# Patient Record
Sex: Female | Born: 1995 | Race: Black or African American | Hispanic: No | Marital: Single | Smoking: Never smoker
Health system: Southern US, Community
[De-identification: ages and names within clinical notes are randomized; demographics above are authoritative.]

## PROBLEM LIST (undated history)

## (undated) DIAGNOSIS — K219 Gastro-esophageal reflux disease without esophagitis: Secondary | ICD-10-CM

## (undated) DIAGNOSIS — R519 Headache, unspecified: Secondary | ICD-10-CM

## (undated) DIAGNOSIS — M419 Scoliosis, unspecified: Secondary | ICD-10-CM

## (undated) DIAGNOSIS — R51 Headache: Secondary | ICD-10-CM

## (undated) DIAGNOSIS — J309 Allergic rhinitis, unspecified: Secondary | ICD-10-CM

## (undated) DIAGNOSIS — E669 Obesity, unspecified: Secondary | ICD-10-CM

## (undated) DIAGNOSIS — M26609 Unspecified temporomandibular joint disorder, unspecified side: Secondary | ICD-10-CM

## (undated) DIAGNOSIS — E119 Type 2 diabetes mellitus without complications: Secondary | ICD-10-CM

## (undated) HISTORY — DX: Allergic rhinitis, unspecified: J30.9

## (undated) HISTORY — DX: Unspecified temporomandibular joint disorder, unspecified side: M26.609

## (undated) HISTORY — PX: OTHER SURGICAL HISTORY: SHX169

## (undated) HISTORY — DX: Headache: R51

## (undated) HISTORY — DX: Obesity, unspecified: E66.9

## (undated) HISTORY — DX: Gastro-esophageal reflux disease without esophagitis: K21.9

## (undated) HISTORY — DX: Headache, unspecified: R51.9

---
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
## 2014-01-31 ENCOUNTER — Encounter (HOSPITAL_COMMUNITY): Payer: Self-pay | Admitting: Emergency Medicine

## 2014-01-31 ENCOUNTER — Emergency Department (HOSPITAL_COMMUNITY)
Admission: EM | Admit: 2014-01-31 | Discharge: 2014-02-01 | Disposition: A | Discharge status: Home / Self Care | Payer: Medicaid Other | Attending: Emergency Medicine | Admitting: Emergency Medicine

## 2014-01-31 ENCOUNTER — Emergency Department (INDEPENDENT_AMBULATORY_CARE_PROVIDER_SITE_OTHER)
Admission: EM | Admit: 2014-01-31 | Discharge: 2014-01-31 | Disposition: A | Discharge status: Home / Self Care | Payer: Medicaid Other | Source: Home / Self Care | Attending: Family Medicine | Admitting: Family Medicine

## 2014-01-31 DIAGNOSIS — M549 Dorsalgia, unspecified: Secondary | ICD-10-CM | POA: Insufficient documentation

## 2014-01-31 DIAGNOSIS — E669 Obesity, unspecified: Secondary | ICD-10-CM | POA: Insufficient documentation

## 2014-01-31 DIAGNOSIS — R630 Anorexia: Secondary | ICD-10-CM | POA: Insufficient documentation

## 2014-01-31 DIAGNOSIS — R1033 Periumbilical pain: Secondary | ICD-10-CM | POA: Insufficient documentation

## 2014-01-31 DIAGNOSIS — R110 Nausea: Secondary | ICD-10-CM

## 2014-01-31 DIAGNOSIS — IMO0001 Reserved for inherently not codable concepts without codable children: Secondary | ICD-10-CM | POA: Insufficient documentation

## 2014-01-31 DIAGNOSIS — R197 Diarrhea, unspecified: Secondary | ICD-10-CM | POA: Insufficient documentation

## 2014-01-31 DIAGNOSIS — Z8739 Personal history of other diseases of the musculoskeletal system and connective tissue: Secondary | ICD-10-CM | POA: Insufficient documentation

## 2014-01-31 DIAGNOSIS — R109 Unspecified abdominal pain: Secondary | ICD-10-CM

## 2014-01-31 DIAGNOSIS — R1013 Epigastric pain: Secondary | ICD-10-CM | POA: Insufficient documentation

## 2014-01-31 DIAGNOSIS — IMO0002 Reserved for concepts with insufficient information to code with codable children: Secondary | ICD-10-CM | POA: Insufficient documentation

## 2014-01-31 LAB — POCT URINALYSIS DIP (DEVICE)
GLUCOSE, UA: NEGATIVE mg/dL
Ketones, ur: NEGATIVE mg/dL
LEUKOCYTES UA: NEGATIVE
NITRITE: NEGATIVE
PH: 5.5 (ref 5.0–8.0)
Protein, ur: NEGATIVE mg/dL
Specific Gravity, Urine: >=1.03 (ref 1.005–1.030)
UROBILINOGEN UA: 1 mg/dL (ref 0.0–1.0)

## 2014-01-31 LAB — CBC WITH DIFFERENTIAL/PLATELET
BASOS ABS: 0 10*3/uL (ref 0.0–0.1)
Basophils Relative: 0 % (ref 0–1)
EOS ABS: 0 10*3/uL (ref 0.0–0.7)
EOS PCT: 1 % (ref 0–5)
HCT: 40.6 % (ref 36.0–46.0)
Hemoglobin: 13.7 g/dL (ref 12.0–15.0)
Lymphocytes Relative: 23 % (ref 12–46)
Lymphs Abs: 1.4 10*3/uL (ref 0.7–4.0)
MCH: 27.6 pg (ref 26.0–34.0)
MCHC: 33.7 g/dL (ref 30.0–36.0)
MCV: 81.9 fL (ref 78.0–100.0)
Monocytes Absolute: 0.4 10*3/uL (ref 0.1–1.0)
Monocytes Relative: 7 % (ref 3–12)
NEUTROS PCT: 69 % (ref 43–77)
Neutro Abs: 4.1 10*3/uL (ref 1.7–7.7)
PLATELETS: 263 10*3/uL (ref 150–400)
RBC: 4.96 MIL/uL (ref 3.87–5.11)
RDW: 12.7 % (ref 11.5–15.5)
WBC: 6 10*3/uL (ref 4.0–10.5)

## 2014-01-31 LAB — COMPREHENSIVE METABOLIC PANEL
ALBUMIN: 3.8 g/dL (ref 3.5–5.2)
ALK PHOS: 97 U/L (ref 39–117)
ALT: 21 U/L (ref 0–35)
AST: 18 U/L (ref 0–37)
BUN: 11 mg/dL (ref 6–23)
CALCIUM: 9.1 mg/dL (ref 8.4–10.5)
CO2: 22 mEq/L (ref 19–32)
Chloride: 104 mEq/L (ref 96–112)
Creatinine, Ser: 0.89 mg/dL (ref 0.50–1.10)
GFR calc Af Amer: >90 mL/min (ref >90)
GFR calc non Af Amer: >90 mL/min (ref >90)
Glucose, Bld: 89 mg/dL (ref 70–99)
POTASSIUM: 3.7 meq/L (ref 3.7–5.3)
SODIUM: 139 meq/L (ref 137–147)
TOTAL PROTEIN: 7.3 g/dL (ref 6.0–8.3)
Total Bilirubin: 1.3 mg/dL — ABNORMAL HIGH (ref 0.3–1.2)

## 2014-01-31 LAB — LIPASE, BLOOD: Lipase: 20 U/L (ref 11–59)

## 2014-01-31 LAB — POCT PREGNANCY, URINE: Preg Test, Ur: NEGATIVE

## 2014-01-31 MED ORDER — GI COCKTAIL ~~LOC~~
30.0000 mL | Freq: Once | ORAL | Status: AC
  Administered 2014-01-31: 30 mL via ORAL
  Filled 2014-01-31: qty 30

## 2014-01-31 MED ORDER — DICYCLOMINE HCL 10 MG PO CAPS
10.0000 mg | ORAL_CAPSULE | Freq: Once | ORAL | Status: AC
  Administered 2014-01-31: 10 mg via ORAL
  Filled 2014-01-31: qty 1

## 2014-01-31 NOTE — ED Provider Notes (Signed)
CSN: [id number]     Arrival date & time 01/31/14  2025 History   First MD Initiated Contact with Patient 01/31/14 2239     Chief Complaint  Patient presents with  . Abdominal Pain  . Back Pain     (Consider location/radiation/quality/duration/timing/severity/associated sxs/prior Treatment) HPI Pt is an 18yo female presenting to ED from UC for concern of appendicitis. Pt c/o abdominal pain radiating into her back. Symptoms have been constant, waking her from her sleep around 5am this morning. Pain is sharp and stabbing in nature, 9/10, associated with nausea and diarrhea.  Reports several episodes of watery diarrhea w/o blood or mucous in stool. Denies preceding episodes of constipation. Pt denies vomiting or fevers. Reports being seen by her PCP at [facility] where UA was performed and tramadol given, advised to f/u April 6th, or return sooner if symptoms not improved. Pt states she did take 1 dose of tramadol w/o relief so she went to [facility] who directed pt to ED.  Reports similar pain last year while pt was at school, states pain felt the same, she tried to eat a cracker but vomited back up. Pt states they did not tell her why her stomach was hurting at that time but states she was given a liquid medication to take as symptoms appeared to occur every time she ate, but states she could not afford the medication at that time, states she never f/u with PCP for incident last year. Denies taking any daily medications.   Past Medical History  Diagnosis Date  . Scoliosis    History reviewed. No pertinent past surgical history. Family History  Problem Relation Age of Onset  . Diabetes Mother   . Hypertension Mother    History  Substance Use Topics  . Smoking status: Not on file  . Smokeless tobacco: Not on file  . Alcohol Use: No   OB History   Grav Para Term Preterm Abortions TAB SAB Ect Mult Living                 Review of Systems  Constitutional: Positive for appetite  change. Negative for fever, chills and fatigue.  Respiratory: Negative for shortness of breath.   Cardiovascular: Negative for chest pain.  Gastrointestinal: Positive for nausea, abdominal pain and diarrhea. Negative for vomiting, constipation and blood in stool.  Genitourinary: Negative for dysuria, frequency, hematuria, flank pain, decreased urine volume, vaginal bleeding, vaginal discharge, vaginal pain, menstrual problem and pelvic pain.  Musculoskeletal: Positive for back pain and myalgias.  All other systems reviewed and are negative.      Allergies  Review of patient's allergies indicates no known allergies.  Home Medications   Current Outpatient Rx  Name  Route  Sig  Dispense  Refill  . PRESCRIPTION MEDICATION   Intramuscular   Inject into the muscle every 3 (three) months. Birth Control -Depo Shot         . traMADol (ULTRAM) 50 MG tablet   Oral   Take 50 mg by mouth 3 (three) times daily as needed.         Marland Kitchen EXPIRED: cetirizine (ZYRTEC) 10 MG tablet   Oral   Take 1 tablet (10 mg total) by mouth daily.   30 tablet   0   . EXPIRED: fluticasone (FLONASE) 50 MCG/ACT nasal spray   Nasal   Place 2 sprays into the nose daily.   16 g   0   . promethazine (PHENERGAN) 25 MG tablet  Oral   Take 1 tablet (25 mg total) by mouth every 6 (six) hours as needed for nausea or vomiting.   12 tablet   0    BP 111/65  Pulse 98  Temp(Src) 98.6 F (37 C) (Oral)  Resp 14  Ht 5\' 3"  (1.6 m)  Wt 201 lb 8 oz (91.4 kg)  BMI 35.70 kg/m2  SpO2 98%  LMP 12/03/2013 Physical Exam  Nursing note and vitals reviewed. Constitutional: She appears well-developed and well-nourished. No distress.  Obese female lying in exam bed, NAD. Appears well, non-toxic.  HENT:  Head: Normocephalic and atraumatic.  Mouth/Throat: Uvula is midline, oropharynx is clear and moist and mucous membranes are normal.  Eyes: Conjunctivae are normal. No scleral icterus.  Neck: Normal range of motion.   Cardiovascular: Normal rate, regular rhythm and normal heart sounds.   Pulmonary/Chest: Effort normal and breath sounds normal. No respiratory distress. She has no wheezes. She has no rales. She exhibits no tenderness.  Abdominal: Soft. Bowel sounds are normal. She exhibits no distension and no mass. There is tenderness. There is no rebound and no guarding.  Obese abdomen, soft, epigastric and umbilical tenderness. No rebound, guarding, or masses. No CVAT.  Musculoskeletal: Normal range of motion.  Neurological: She is alert.  Skin: Skin is warm and dry. She is not diaphoretic.    ED Course  Procedures (including critical care time) Labs Review Labs Reviewed  COMPREHENSIVE METABOLIC PANEL - Abnormal; Notable for the following:    Total Bilirubin 1.3 (*)    All other components within normal limits  CBC WITH DIFFERENTIAL  LIPASE, BLOOD   Imaging Review No results found.   EKG Interpretation None      MDM   Final diagnoses:  Nausea  Diarrhea  Abdominal pain    Pt c/o abdominal pain associated with nausea, and diarrhea w/o blood or mucous in stool. Pt sent by UC for further evaluation for possible appendicitis.  Pt appears well, non-toxic, afebrile.  Abd-obese, soft, tender in epigastrium and umbilical region.  No rebound, guarding, or masses.  Pt had negative UA at PCP Labs: CBC, CMP, Lipase: unremarkable.  Not concerned for appendicitis, cholecystitis, SBO, or other emergent process taking place at this time. Bentyl and GI cocktail given.  Pt reassessed around 12:54AM. Pt sleeping comfortably, easily awakened.  Abdomen-soft, mild tenderness, no focal tenderness, rebound or guarding.   Will discharge pt home, discussed findings with pt and family. Discussed pt likely has gastroenteritis, family member then stated she and her son had recently been sick with similar symptoms. Advised pt f/u with PCP by the end of the week if not improving. Encouraged rest and fluids tomorrow.   Return precautions provided. Pt verbalized understanding and agreement with tx plan.        Junius FinnerErin O'Malley, PA-C 02/01/14 0122

## 2014-01-31 NOTE — ED Notes (Signed)
Pt coming from urgent care with c/o mid abdominal pain since five this morning. Pt reports diarrhea x 2 and nausea. Denies fevers at home. Pt states she was sent here for r/o appendicitis. Pt is A&Ox4, respirations equal and unlabored, skin warm and dry.

## 2014-01-31 NOTE — ED Notes (Signed)
C/o periumbilical pain onset this AM and radiates to her back.  C/o nausea when she burps but no vomiting.  C/o diarrhea once last night and 2 today. C/o getting cold but has not checked temp.  C/o body aches.

## 2014-01-31 NOTE — ED Provider Notes (Signed)
Lindsey ArenaDanielle Moon is a 18 y.o. female who presents to Urgent Care today for abdominal pain. Patient was awoken from sleep by abdominal pain at about 5:00 this morning. Her abdominal pain has steadily worsened throughout today. She was seen at her primary care office ([facility]) where she had urine tests and was given tramadol. Her pain worsened resulting in [facility]. She denies any vomiting but does note nausea. She has had several episodes of diarrhea. She denies any preceding constipation. She denies any significant fevers or chills. She does note decreased appetite today.   Past Medical History  Diagnosis Date  . Scoliosis    History  Substance Use Topics  . Smoking status: Not on file  . Smokeless tobacco: Not on file  . Alcohol Use: No   ROS as above Medications: No current facility-administered medications for this encounter.   Current Outpatient Prescriptions  Medication Sig Dispense Refill  . PRESCRIPTION MEDICATION Inject into the muscle every 3 (three) months. Birth Control -Depo Shot      . traMADol (ULTRAM) 50 MG tablet Take 50 mg by mouth 3 (three) times daily as needed.      . cetirizine (ZYRTEC) 10 MG tablet Take 1 tablet (10 mg total) by mouth daily.  30 tablet  0  . fluticasone (FLONASE) 50 MCG/ACT nasal spray Place 2 sprays into the nose daily.  16 g  0    Exam:  BP 108/73  Pulse 104  Temp(Src) 98.6 F (37 C) (Oral)  Resp 16  SpO2 100%  LMP 12/03/2013 Gen: Well NAD HEENT: EOMI,  MMM Lungs: Normal work of breathing. CTABL Heart: RRR no MRG Abd: NABS, tender to palpation central lower abdomen with guarding. No rebound. Left CV angle tenderness to percussion  Exts: Brisk capillary refill, warm and well perfused.   Results for orders placed during the hospital encounter of 01/31/14 (from the past 24 hour(s))  POCT URINALYSIS DIP (DEVICE)     Status: Abnormal   Collection Time    01/31/14  7:20 PM      Result Value Ref Range   Glucose, UA NEGATIVE  NEGATIVE mg/dL   Bilirubin Urine SMALL (*) NEGATIVE   Ketones, ur NEGATIVE  NEGATIVE mg/dL   Specific Gravity, Urine >=1.030  1.005 - 1.030   Hgb urine dipstick TRACE (*) NEGATIVE   pH 5.5  5.0 - 8.0   Protein, ur NEGATIVE  NEGATIVE mg/dL   Urobilinogen, UA 1.0  0.0 - 1.0 mg/dL   Nitrite NEGATIVE  NEGATIVE   Leukocytes, UA NEGATIVE  NEGATIVE  POCT PREGNANCY, URINE     Status: None   Collection Time    01/31/14  7:27 PM      Result Value Ref Range   Preg Test, Ur NEGATIVE  NEGATIVE   No results found.  Assessment and Plan: 18 y.o. female with  abdominal pain with guarding. Patient has worsening abdominal pain with guarding started this morning. This is concerning for appendicitis. Plan to change her to the emergency room for evaluation and management of this issue.   Discussed warning signs or symptoms. Please see discharge instructions. Patient expresses understanding.    Rodolph BongEvan S Charlina Dwight, MD 01/31/14 [id number]

## 2014-02-01 MED ORDER — PROMETHAZINE HCL 25 MG PO TABS: 25.0000 mg | ORAL_TABLET | Freq: Four times a day (QID) | ORAL | Status: DC | PRN

## 2014-02-01 NOTE — ED Notes (Signed)
Pt alert and oriented and neg visible distress

## 2014-02-03 NOTE — ED Provider Notes (Signed)
Medical screening examination/treatment/procedure(s) were performed by non-physician practitioner and as supervising physician I was immediately available for consultation/collaboration.   EKG Interpretation None       Dalina Samara, MD 02/03/14 1907 

## 2014-06-06 ENCOUNTER — Emergency Department (HOSPITAL_COMMUNITY)
Admission: EM | Admit: 2014-06-06 | Discharge: 2014-06-07 | Disposition: A | Discharge status: Home / Self Care | Payer: Medicaid Other | Attending: Emergency Medicine | Admitting: Emergency Medicine

## 2014-06-06 ENCOUNTER — Encounter (HOSPITAL_COMMUNITY): Payer: Self-pay | Admitting: Emergency Medicine

## 2014-06-06 ENCOUNTER — Emergency Department (HOSPITAL_COMMUNITY): Payer: Medicaid Other

## 2014-06-06 DIAGNOSIS — M4120 Other idiopathic scoliosis, site unspecified: Secondary | ICD-10-CM | POA: Diagnosis not present

## 2014-06-06 DIAGNOSIS — R079 Chest pain, unspecified: Secondary | ICD-10-CM | POA: Diagnosis present

## 2014-06-06 DIAGNOSIS — Z79899 Other long term (current) drug therapy: Secondary | ICD-10-CM | POA: Insufficient documentation

## 2014-06-06 DIAGNOSIS — R0789 Other chest pain: Secondary | ICD-10-CM | POA: Diagnosis not present

## 2014-06-06 DIAGNOSIS — IMO0001 Reserved for inherently not codable concepts without codable children: Secondary | ICD-10-CM | POA: Insufficient documentation

## 2014-06-06 DIAGNOSIS — M546 Pain in thoracic spine: Secondary | ICD-10-CM | POA: Insufficient documentation

## 2014-06-06 LAB — BASIC METABOLIC PANEL
Anion gap: 16 — ABNORMAL HIGH (ref 5–15)
BUN: 8 mg/dL (ref 6–23)
CHLORIDE: 103 meq/L (ref 96–112)
CO2: 22 meq/L (ref 19–32)
CREATININE: 0.86 mg/dL (ref 0.50–1.10)
Calcium: 9.5 mg/dL (ref 8.4–10.5)
GFR calc Af Amer: >90 mL/min (ref >90)
GFR calc non Af Amer: >90 mL/min (ref >90)
GLUCOSE: 110 mg/dL — AB (ref 70–99)
Potassium: 3.9 mEq/L (ref 3.7–5.3)
Sodium: 141 mEq/L (ref 137–147)

## 2014-06-06 LAB — CBC
HCT: 39.2 % (ref 36.0–46.0)
HEMOGLOBIN: 12.8 g/dL (ref 12.0–15.0)
MCH: 26.2 pg (ref 26.0–34.0)
MCHC: 32.7 g/dL (ref 30.0–36.0)
MCV: 80.3 fL (ref 78.0–100.0)
Platelets: 257 10*3/uL (ref 150–400)
RBC: 4.88 MIL/uL (ref 3.87–5.11)
RDW: 12.8 % (ref 11.5–15.5)
WBC: 5.7 10*3/uL (ref 4.0–10.5)

## 2014-06-06 LAB — I-STAT TROPONIN, ED: TROPONIN I, POC: 0 ng/mL (ref 0.00–0.08)

## 2014-06-06 MED ORDER — METHOCARBAMOL 500 MG PO TABS: 500.0000 mg | ORAL_TABLET | Freq: Three times a day (TID) | ORAL | Status: DC | PRN

## 2014-06-06 MED ORDER — IBUPROFEN 400 MG PO TABS: 400.0000 mg | ORAL_TABLET | Freq: Three times a day (TID) | ORAL | Status: DC | PRN

## 2014-06-06 MED ORDER — METHOCARBAMOL 500 MG PO TABS
500.0000 mg | ORAL_TABLET | Freq: Once | ORAL | Status: AC
  Administered 2014-06-06: 500 mg via ORAL
  Filled 2014-06-06: qty 1

## 2014-06-06 MED ORDER — IBUPROFEN 200 MG PO TABS
400.0000 mg | ORAL_TABLET | Freq: Once | ORAL | Status: AC
  Administered 2014-06-06: 400 mg via ORAL
  Filled 2014-06-06: qty 2

## 2014-06-06 NOTE — ED Notes (Signed)
Pt states that she has been having chest pain intermittently for months that has turned into upper right back pain over the past couple days.  Pt states history of back pain

## 2014-06-06 NOTE — ED Provider Notes (Signed)
CSN: [id number]     Arrival date & time 06/06/14  2040 History   First MD Initiated Contact with Patient 06/06/14 2307     Chief Complaint  Patient presents with  . Chest Pain     (Consider location/radiation/quality/duration/timing/severity/associated sxs/prior Treatment) HPI Patient presents with episodic left sided upper chest pain for the past few months. She states the pain is sharp and lasts only a few seconds. There is no known exacerbating or alleviating factors. The patient currently does not have any chest pain. Patient states she does suffer from chronic right thoracic back pain which is felt to be due to her scoliosis. She occasionally takes Tylenol for this. This is unchanged for the past few months. She denies any shortness of breath. She has no lower extremity swelling or pain. She's no recent extended travel or immobilization. Past Medical History  Diagnosis Date  . Scoliosis    History reviewed. No pertinent past surgical history. Family History  Problem Relation Age of Onset  . Diabetes Mother   . Hypertension Mother    History  Substance Use Topics  . Smoking status: Never Smoker   . Smokeless tobacco: Not on file  . Alcohol Use: No   OB History   Grav Para Term Preterm Abortions TAB SAB Ect Mult Living                 Review of Systems  Constitutional: Negative for fever and chills.  Respiratory: Negative for cough and shortness of breath.   Cardiovascular: Positive for chest pain. Negative for palpitations and leg swelling.  Gastrointestinal: Negative for nausea, vomiting, abdominal pain, diarrhea and constipation.  Genitourinary: Negative for dysuria.  Musculoskeletal: Positive for back pain and myalgias. Negative for neck pain and neck stiffness.  Skin: Negative for rash and wound.  Neurological: Negative for dizziness, weakness, light-headedness, numbness and headaches.  All other systems reviewed and are negative.     Allergies  Review of  patient's allergies indicates no known allergies.  Home Medications   Prior to Admission medications   Medication Sig Start Date End Date Taking? Authorizing Provider  fluticasone (FLONASE) 50 MCG/ACT nasal spray Place 2 sprays into both nostrils daily as needed for allergies or rhinitis.   Yes Historical Provider, MD  PRESCRIPTION MEDICATION Inject into the muscle every 3 (three) months. Birth Control -Depo Shot   Yes Historical Provider, MD   BP 109/70  Pulse 85  Temp(Src) 97.6 F (36.4 C) (Oral)  Resp 16  SpO2 100% Physical Exam  Nursing note and vitals reviewed. Constitutional: She is oriented to person, place, and time. She appears well-developed and well-nourished. No distress.  HENT:  Head: Normocephalic and atraumatic.  Mouth/Throat: Oropharynx is clear and moist.  Eyes: EOM are normal. Pupils are equal, round, and reactive to light.  Neck: Normal range of motion. Neck supple.  Cardiovascular: Normal rate and regular rhythm.  Exam reveals no gallop and no friction rub.   No murmur heard. Pulmonary/Chest: Effort normal and breath sounds normal. No respiratory distress. She has no wheezes. She has no rales. She exhibits no tenderness.  Abdominal: Soft. Bowel sounds are normal. She exhibits no distension and no mass. There is no tenderness. There is no rebound and no guarding.  Musculoskeletal: Normal range of motion. She exhibits tenderness (tender to palpation in the region of the right paraspinal thoracic muscles. Patient also has tenderness over the right lower rhomboids). She exhibits no edema.  No calf swelling or tenderness.  Neurological: She  is alert and oriented to person, place, and time.  Skin: Skin is warm and dry. No rash noted. No erythema.  Psychiatric: She has a normal mood and affect. Her behavior is normal.    ED Course  Procedures (including critical care time) Labs Review Labs Reviewed  BASIC METABOLIC PANEL - Abnormal; Notable for the following:     Glucose, Bld 110 (*)    Anion gap 16 (*)    All other components within normal limits  CBC  I-STAT TROPOININ, ED    Imaging Review Dg Chest 2 View  06/06/2014   CLINICAL DATA:  Chest pain  EXAM: CHEST  2 VIEW  COMPARISON:  None.  FINDINGS: The heart size and mediastinal contours are within normal limits. Both lungs are clear. The visualized skeletal structures are unremarkable.  IMPRESSION: No active cardiopulmonary disease.   Electronically Signed   By: Alcide CleverMark  Lukens M.D.   On: 06/06/2014 21:38     EKG Interpretation   Date/Time:  Tuesday June 06 2014 20:45:11 EDT Ventricular Rate:  105 PR Interval:  140 QRS Duration: 78 QT Interval:  328 QTC Calculation: 433 R Axis:   60 Text Interpretation:  Sinus tachycardia Otherwise normal ECG Confirmed by  Ranae PalmsYELVERTON  MD, Laraina Sulton ([id number]) on 06/06/2014 11:11:59 PM      MDM   Final diagnoses:  None    Patient with very atypical chest pain. Doubt coronary artery disease given no risk factors or PE. Suspect likely musculoskeletal in nature. Will start on ibuprofen and muscle relaxant. Patient been given return precautions. She also is advised to follow with her primary Dr.    Loren Raceravid Tresea Heine, MD 06/06/14 [phone]

## 2014-06-06 NOTE — Discharge Instructions (Signed)
Chest Pain (Nonspecific) °It is often hard to give a specific diagnosis for the cause of chest pain. There is always a chance that your pain could be related to something serious, such as a heart attack or a blood clot in the lungs. You need to follow up with your health care provider for further evaluation. °CAUSES  °· Heartburn. °· Pneumonia or bronchitis. °· Anxiety or stress. °· Inflammation around your heart (pericarditis) or lung (pleuritis or pleurisy). °· A blood clot in the lung. °· A collapsed lung (pneumothorax). It can develop suddenly on its own (spontaneous pneumothorax) or from trauma to the chest. °· Shingles infection (herpes zoster virus). °The chest wall is composed of bones, muscles, and cartilage. Any of these can be the source of the pain. °· The bones can be bruised by injury. °· The muscles or cartilage can be strained by coughing or overwork. °· The cartilage can be affected by inflammation and become sore (costochondritis). °DIAGNOSIS  °Lab tests or other studies may be needed to find the cause of your pain. Your health care provider may have you take a test called an ambulatory electrocardiogram (ECG). An ECG records your heartbeat patterns over a 24-hour period. You may also have other tests, such as: °· Transthoracic echocardiogram (TTE). During echocardiography, sound waves are used to evaluate how blood flows through your heart. °· Transesophageal echocardiogram (TEE). °· Cardiac monitoring. This allows your health care provider to monitor your heart rate and rhythm in real time. °· Holter monitor. This is a portable device that records your heartbeat and can help diagnose heart arrhythmias. It allows your health care provider to track your heart activity for several days, if needed. °· Stress tests by exercise or by giving medicine that makes the heart beat faster. °TREATMENT  °· Treatment depends on what may be causing your chest pain. Treatment may include: °¨ Acid blockers for  heartburn. °¨ Anti-inflammatory medicine. °¨ Pain medicine for inflammatory conditions. °¨ Antibiotics if an infection is present. °· You may be advised to change lifestyle habits. This includes stopping smoking and avoiding alcohol, caffeine, and chocolate. °· You may be advised to keep your head raised (elevated) when sleeping. This reduces the chance of acid going backward from your stomach into your esophagus. °Most of the time, nonspecific chest pain will improve within 2-3 days with rest and mild pain medicine.  °HOME CARE INSTRUCTIONS  °· If antibiotics were prescribed, take them as directed. Finish them even if you start to feel better. °· For the next few days, avoid physical activities that bring on chest pain. Continue physical activities as directed. °· Do not use any tobacco products, including cigarettes, chewing tobacco, or electronic cigarettes. °· Avoid drinking alcohol. °· Only take medicine as directed by your health care provider. °· Follow your health care provider's suggestions for further testing if your chest pain does not go away. °· Keep any follow-up appointments you made. If you do not go to an appointment, you could develop lasting (chronic) problems with pain. If there is any problem keeping an appointment, call to reschedule. °SEEK MEDICAL CARE IF:  °· Your chest pain does not go away, even after treatment. °· You have a rash with blisters on your chest. °· You have a fever. °SEEK IMMEDIATE MEDICAL CARE IF:  °· You have increased chest pain or pain that spreads to your arm, neck, jaw, back, or abdomen. °· You have shortness of breath. °· You have an increasing cough, or you cough   up blood. °· You have severe back or abdominal pain. °· You feel nauseous or vomit. °· You have severe weakness. °· You faint. °· You have chills. °This is an emergency. Do not wait to see if the pain will go away. Get medical help at once. Call your local emergency services (911 in [place] Do not drive  yourself to the hospital. °MAKE SURE YOU:  °· Understand these instructions. °· Will watch your condition. °· Will get help right away if you are not doing well or get worse. °Document Released: 08/20/2005 Document Revised: 11/15/2013 Document Reviewed: 06/15/2008 °[employer]® Patient Information ©2015 [employer]. This information is not intended to replace advice given to you by your health care provider. Make sure you discuss any questions you have with your health care provider. °Musculoskeletal Pain °Musculoskeletal pain is muscle and boney aches and pains. These pains can occur in any part of the body. Your caregiver may treat you without knowing the cause of the pain. They may treat you if blood or urine tests, X-rays, and other tests were normal.  °CAUSES °There is often not a definite cause or reason for these pains. These pains may be caused by a type of germ (virus). The discomfort may also come from overuse. Overuse includes working out too hard when your body is not fit. Boney aches also come from weather changes. Bone is sensitive to atmospheric pressure changes. °HOME CARE INSTRUCTIONS  °· Ask when your test results will be ready. Make sure you get your test results. °· Only take over-the-counter or prescription medicines for pain, discomfort, or fever as directed by your caregiver. If you were given medications for your condition, do not drive, operate machinery or power tools, or sign legal documents for 24 hours. Do not drink alcohol. Do not take sleeping pills or other medications that may interfere with treatment. °· Continue all activities unless the activities cause more pain. When the pain lessens, slowly resume normal activities. Gradually increase the intensity and duration of the activities or exercise. °· During periods of severe pain, bed rest may be helpful. Lay or sit in any position that is comfortable. °· Putting ice on the injured area. °¨ Put ice in a bag. °¨ Place a towel between  your skin and the bag. °¨ Leave the ice on for 15 to 20 minutes, 3 to 4 times a day. °· Follow up with your caregiver for continued problems and no reason can be found for the pain. If the pain becomes worse or does not go away, it may be necessary to repeat tests or do additional testing. Your caregiver may need to look further for a possible cause. °SEEK IMMEDIATE MEDICAL CARE IF: °· You have pain that is getting worse and is not relieved by medications. °· You develop chest pain that is associated with shortness or breath, sweating, feeling sick to your stomach (nauseous), or throw up (vomit). °· Your pain becomes localized to the abdomen. °· You develop any new symptoms that seem different or that concern you. °MAKE SURE YOU:  °· Understand these instructions. °· Will watch your condition. °· Will get help right away if you are not doing well or get worse. °Document Released: 11/10/2005 Document Revised: 02/02/2012 Document Reviewed: 07/15/2013 °[employer]® Patient Information ©2015 [employer]. This information is not intended to replace advice given to you by your health care provider. Make sure you discuss any questions you have with your health care provider. ° °

## 2014-07-28 ENCOUNTER — Encounter (HOSPITAL_COMMUNITY): Payer: Self-pay | Admitting: Emergency Medicine

## 2014-07-28 ENCOUNTER — Emergency Department (HOSPITAL_COMMUNITY)
Admission: EM | Admit: 2014-07-28 | Discharge: 2014-07-28 | Disposition: A | Discharge status: Home / Self Care | Payer: Medicaid Other | Attending: Emergency Medicine | Admitting: Emergency Medicine

## 2014-07-28 ENCOUNTER — Emergency Department (HOSPITAL_COMMUNITY): Payer: Medicaid Other

## 2014-07-28 DIAGNOSIS — Z3202 Encounter for pregnancy test, result negative: Secondary | ICD-10-CM | POA: Diagnosis not present

## 2014-07-28 DIAGNOSIS — M545 Low back pain, unspecified: Secondary | ICD-10-CM | POA: Diagnosis not present

## 2014-07-28 DIAGNOSIS — J029 Acute pharyngitis, unspecified: Secondary | ICD-10-CM | POA: Diagnosis present

## 2014-07-28 DIAGNOSIS — Z9109 Other allergy status, other than to drugs and biological substances: Secondary | ICD-10-CM

## 2014-07-28 DIAGNOSIS — J069 Acute upper respiratory infection, unspecified: Secondary | ICD-10-CM | POA: Diagnosis not present

## 2014-07-28 DIAGNOSIS — J309 Allergic rhinitis, unspecified: Secondary | ICD-10-CM | POA: Insufficient documentation

## 2014-07-28 LAB — URINALYSIS, ROUTINE W REFLEX MICROSCOPIC
BILIRUBIN URINE: NEGATIVE
Glucose, UA: NEGATIVE mg/dL
HGB URINE DIPSTICK: NEGATIVE
KETONES UR: NEGATIVE mg/dL
Leukocytes, UA: NEGATIVE
NITRITE: NEGATIVE
PH: 5.5 (ref 5.0–8.0)
Protein, ur: NEGATIVE mg/dL
Specific Gravity, Urine: 1.023 (ref 1.005–1.030)
Urobilinogen, UA: 1 mg/dL (ref 0.0–1.0)

## 2014-07-28 LAB — RAPID STREP SCREEN (MED CTR MEBANE ONLY): STREPTOCOCCUS, GROUP A SCREEN (DIRECT): NEGATIVE

## 2014-07-28 LAB — POC URINE PREG, ED: PREG TEST UR: NEGATIVE

## 2014-07-28 MED ORDER — AZITHROMYCIN 250 MG PO TABS: 250.0000 mg | ORAL_TABLET | Freq: Every day | ORAL | Status: DC

## 2014-07-28 NOTE — ED Provider Notes (Signed)
CSN: [id number]     Arrival date & time 07/28/14  1445 History  This chart was scribed for Raymon Mutton, PA-C working with Ethelda Chick, MD by Evon Slack, ED Scribe. This patient was seen in room TR06C/TR06C and the patient's care was started at 4:13 PM.     Chief Complaint  Patient presents with  . Sore Throat   HPI HPI Comments: CHALSEA DARKO is a 18 y.o. female with PMHx of scoliosis presenting to the ED with sore throat, nasal congestion, cough that has been ongoing for the past month. Patient reports that she's been coughing up a yellow phlegm. Reported that she's been having a sore throat for the past 3 days described as a soreness when swallowing. Stated that her mother has similar symptoms. Stated that she has not been using any medications over-the-counter. Reported that when she woke up this morning she started to experience lower back pain, stated that she normally does have back pain secondary to her scoliosis. Denied fever, chills, nausea, vomiting, diarrhea, abdominal pain, neck pain, urinary and bowel issues, urinary bowel incontinence, fall, injury, ear pain, eye complaints. Patient reported that she is on Depo.  PCP none  Past Medical History  Diagnosis Date  . Scoliosis    History reviewed. No pertinent past surgical history. Family History  Problem Relation Age of Onset  . Diabetes Mother   . Hypertension Mother    History  Substance Use Topics  . Smoking status: Never Smoker   . Smokeless tobacco: Not on file  . Alcohol Use: No   OB History   Grav Para Term Preterm Abortions TAB SAB Ect Mult Living                 Review of Systems  HENT: Positive for congestion, sneezing and sore throat.   Respiratory: Positive for cough.   Gastrointestinal: Negative for nausea, vomiting, abdominal pain and diarrhea.  Genitourinary: Negative for dysuria.  Musculoskeletal: Positive for back pain. Negative for neck pain.   Allergies  Review of patient's  allergies indicates no known allergies.  Home Medications   Prior to Admission medications   Medication Sig Start Date End Date Taking? Authorizing Provider  azithromycin (ZITHROMAX) 250 MG tablet Take 1 tablet (250 mg total) by mouth daily. Take first 2 tablets together, then 1 every day until finished. 07/28/14   Dionne Rossa, PA-C   Triage Vitals: BP 102/58  Pulse 96  Temp(Src) 97.8 F (36.6 C)  Resp 18  Ht  (1.6 m)  Wt 205 lb (92.987 kg)  BMI 36.32 kg/m2  SpO2 100%  Physical Exam  Nursing note and vitals reviewed. Constitutional: She is oriented to person, place, and time. She appears well-developed and well-nourished. No distress.  HENT:  Head: Normocephalic and atraumatic.  Mouth/Throat: Oropharynx is clear and moist. No oropharyngeal exudate.  Negative erythema, inflammation, lesions, sores, deformities, exudate, petechiae identified to the posterior oropharynx. Negative exudate identified to the tonsils bilaterally. Negative tonsillar adenopathy noted. Negative uvula swelling. Uvula midline with symmetrical elevation. Negative trismus. Negative sublingual lesions.  Eyes: Conjunctivae and EOM are normal. Pupils are equal, round, and reactive to light. Right eye exhibits no discharge. Left eye exhibits no discharge.  Neck: Normal range of motion. Neck supple. No tracheal deviation present.  Negative neck stiffness Negative nuchal rigidity Negative cervical lymphadenopathy Negative meningeal signs Negative swelling to the neck  Cardiovascular: Normal rate, regular rhythm and normal heart sounds.  Exam reveals no friction rub.  No murmur heard. Pulses:      Radial pulses are 2+ on the right side, and 2+ on the left side.  Cap refill less than 3 seconds  Pulmonary/Chest: Effort normal and breath sounds normal. No respiratory distress. She has no wheezes. She has no rales. She exhibits no tenderness.  Patient is able to speak in full sentences without  difficulty Negative use of accessory muscles Negative stridor Negative pain upon palpation to the chest wall  Musculoskeletal: Normal range of motion. She exhibits tenderness.       Lumbar back: She exhibits tenderness. She exhibits normal range of motion, no bony tenderness, no swelling, no edema, no deformity and no laceration.       Back:  Negative deformities identified to the spine-mild discomfort upon palpation to the lumbosacral region upon palpation  Full ROM to upper and lower extremities without difficulty noted, negative ataxia noted.  Lymphadenopathy:    She has no cervical adenopathy.  Neurological: She is alert and oriented to person, place, and time. No cranial nerve deficit. She exhibits normal muscle tone. Coordination normal.  Cranial nerves III-XII grossly intact Strength 5+/5+ to upper and lower extremities bilaterally with resistance applied, equal distribution noted Equal grip strength bilaterally Sensation intact Negative facial droop Negative slurred speech Negative aphasia Negative arm drift Fine motor skills intact   Skin: Skin is warm and dry.  Psychiatric: She has a normal mood and affect. Her behavior is normal.    ED Course  Procedures (including critical care time) DIAGNOSTIC STUDIES: Oxygen Saturation is 100% on RA, normal by my interpretation.    COORDINATION OF CARE: 4:35 PM-Discussed treatment plan which includes CXR, back x_ray and strep screen  with pt at bedside and pt agreed to plan.   Results for orders placed during the hospital encounter of 07/28/14  RAPID STREP SCREEN      Result Value Ref Range   Streptococcus, Group A Screen (Direct) NEGATIVE  NEGATIVE  URINALYSIS, ROUTINE W REFLEX MICROSCOPIC      Result Value Ref Range   Color, Urine AMBER (*) YELLOW   APPearance CLEAR  CLEAR   Specific Gravity, Urine 1.023  1.005 - 1.030   pH 5.5  5.0 - 8.0   Glucose, UA NEGATIVE  NEGATIVE mg/dL   Hgb urine dipstick NEGATIVE  NEGATIVE    Bilirubin Urine NEGATIVE  NEGATIVE   Ketones, ur NEGATIVE  NEGATIVE mg/dL   Protein, ur NEGATIVE  NEGATIVE mg/dL   Urobilinogen, UA 1.0  0.0 - 1.0 mg/dL   Nitrite NEGATIVE  NEGATIVE   Leukocytes, UA NEGATIVE  NEGATIVE  POC URINE PREG, ED      Result Value Ref Range   Preg Test, Ur NEGATIVE  NEGATIVE     Labs Review Labs Reviewed  URINALYSIS, ROUTINE W REFLEX MICROSCOPIC - Abnormal; Notable for the following:    Color, Urine AMBER (*)    All other components within normal limits  RAPID STREP SCREEN  CULTURE, GROUP A STREP  POC URINE PREG, ED    Imaging Review Dg Chest 2 View  07/28/2014   CLINICAL DATA:  Cough, shortness of breath and sore throat for 3-4 days  EXAM: CHEST  2 VIEW  COMPARISON:  06/06/2014  FINDINGS: Grossly unchanged cardiac silhouette and mediastinal contours. No focal parenchymal opacities. No pleural effusion or pneumothorax. No evidence of edema. No acute osseus abnormalities.  IMPRESSION: No acute cardiopulmonary disease. Specifically, no evidence of pneumonia.   Electronically Signed   By: Simonne Come  M.D.   On: 07/28/2014 17:48   Dg Lumbar Spine Complete  07/28/2014   CLINICAL DATA:  Pain and low back for 1 day.  History scoliosis.  EXAM: LUMBAR SPINE - COMPLETE 4+ VIEW  COMPARISON:  None.  FINDINGS: There are 5 non rib-bearing lumbar type vertebral bodies.  There is straightening of the expected lumbar lordosis. No anterolisthesis or retrolisthesis. No significant scoliotic curvature of the lumbar spine. No definite pars defects.  Lumbar vertebral body heights are preserved.  Intervertebral disc spaces are preserved.  Limited visualization the bilateral SI joints is normal.  Regional bowel gas pattern and soft tissues appear normal.  IMPRESSION: Straightening of the expected lumbar lordosis, nonspecific though could be seen in the setting of muscle spasm. Otherwise, no explanation for patient's low back pain.   Electronically Signed   By: Simonne Come M.D.   On:  07/28/2014 17:48     EKG Interpretation None      MDM   Final diagnoses:  URI, acute  Environmental allergies   Medications - No data to display  Filed Vitals:   07/28/14 1540  BP: 102/58  Pulse: 96  Temp: 97.8 F (36.6 C)  Resp: 18  Height:  (1.6 m)  Weight: 205 lb (92.987 kg)  SpO2: 100%   I personally performed the services described in this documentation, which was scribed in my presence. The recorded information has been reviewed and is accurate.  Rapid strep negative. Urine pregnancy negative. Urinalysis negative for infection-negative nitrites or leukocytes identified. Lumbar plain film noted straightening of the expected lumbar lordosis nonspecific though. It could be seen in the setting of muscle spasm-no acute abnormalities identified. Chest x-ray negative for acute cardiopulmonary disease. Specifically no evidence of pneumonia. Doubt peritonsillar abscess. Doubt retropharyngeal abscess. Doubt streptococcal pharyngitis. Doubt UTI. Doubt pyelonephritis. Doubt epidural abscess. Doubt cauda equina. Negative findings of pneumonia. Suspicion to be allergies, environmental - cannot rule out possible bronchitis. Patient stable, afebrile. Patient not septic appearing. Discharged patient. Discharge patient with antibiotics. Discussed with patient to rest and stay hydrated. Discussed with patient to followup with primary care provider and to go on with allergy testing. Discussed with patient to closely monitor symptoms and if symptoms are to worsen or change to report back to the ED - strict return instructions given.  Patient agreed to plan of care, understood, all questions answered.   Raymon Mutton, PA-C 07/29/14 [phone]

## 2014-07-28 NOTE — ED Notes (Signed)
Pt also reports middle upper back pain worse with movement or coughing or yawning or sneezing. Denies fevers.

## 2014-07-28 NOTE — ED Notes (Signed)
The pt has had a cold for one month with cough sorethroat and she has some lower back pain that she has had since she was a  Child.  lmp  None depo

## 2014-07-28 NOTE — Discharge Instructions (Signed)
Please call your doctor for a followup appointment within 24-48 hours. When you talk to your doctor please let them know that you were seen in the emergency department and have them acquire all of your records so that they can discuss the findings with you and formulate a treatment plan to fully care for your new and ongoing problems. Call and set up an appointment with your primary care provider Please rest and stay hydrated Please take antibiotics as prescribed Please decrease use of air-conditioner for this can cause nasal congestion Please follow through with allergy test Please continue to monitor symptoms closely and if symptoms are to worsen or change (fever greater than 101, chills, sweating, nausea, vomiting, chest pain, shortness of breathe, difficulty breathing, weakness, numbness, tingling, worsening or changes to pain pattern, throat closing sensation, difficulty swallowing, coughing up of blood, blurred vision, sudden loss of vision) please report back to the Emergency Department immediately.   Upper Respiratory Infection, Adult An upper respiratory infection (URI) is also known as the common cold. It is often caused by a type of germ (virus). Colds are easily spread (contagious). You can pass it to others by kissing, coughing, sneezing, or drinking out of the same glass. Usually, you get better in 1 or 2 weeks.  HOME CARE   Only take medicine as told by your doctor.  Use a warm mist humidifier or breathe in steam from a hot shower.  Drink enough water and fluids to keep your pee (urine) clear or pale yellow.  Get plenty of rest.  Return to work when your temperature is back to normal or as told by your doctor. You may use a face mask and wash your hands to stop your cold from spreading. GET HELP RIGHT AWAY IF:   After the first few days, you feel you are getting worse.  You have questions about your medicine.  You have chills, shortness of breath, or brown or red spit  (mucus).  You have yellow or brown snot (nasal discharge) or pain in the face, especially when you bend forward.  You have a fever, puffy (swollen) neck, pain when you swallow, or white spots in the back of your throat.  You have a bad headache, ear pain, sinus pain, or chest pain.  You have a high-pitched whistling sound when you breathe in and out (wheezing).  You have a lasting cough or cough up blood.  You have sore muscles or a stiff neck. MAKE SURE YOU:   Understand these instructions.  Will watch your condition.  Will get help right away if you are not doing well or get worse. Document Released: 04/28/2008 Document Revised: 02/02/2012 Document Reviewed: 02/15/2014 [facility] Patient Information 2015 [place], [place]. This information is not intended to replace advice given to you by your health care provider. Make sure you discuss any questions you have with your health care provider.   Allergies  Allergies may happen from anything your body is sensitive to. This may be food, medicines, pollens, chemicals, and many other things. Food allergies can be severe and deadly.  HOME CARE  If you do not know what causes a reaction, keep a diary. Write down the foods you ate and the symptoms that followed. Avoid foods that cause reactions.  If you have red raised spots (hives) or a rash:  Take medicine as told by your doctor.  Use medicines for red raised spots and itching as needed.  Apply cold cloths (compresses) to the skin. Take a cool  bath. Avoid hot baths or showers.  If you are severely allergic:  It is often necessary to go to the hospital after you have treated your reaction.  Wear your medical alert jewelry.  You and your family must learn how to give a allergy shot or use an allergy kit (anaphylaxis kit).  Always carry your allergy kit or shot with you. Use this medicine as told by your doctor if a severe reaction is occurring. GET HELP RIGHT AWAY IF:  You have  trouble breathing or are making high-pitched whistling sounds (wheezing).  You have a tight feeling in your chest or throat.  You have a puffy (swollen) mouth.  You have red raised spots, puffiness (swelling), or itching all over your body.  You have had a severe reaction that was helped by your allergy kit or shot. The reaction can return once the medicine has worn off.  You think you are having a food allergy. Symptoms most often happen within 30 minutes of eating a food.  Your symptoms have not gone away within 2 days or are getting worse.  You have new symptoms.  You want to retest yourself with a food or drink you think causes an allergic reaction. Only do this under the care of a doctor. MAKE SURE YOU:   Understand these instructions.  Will watch your condition.  Will get help right away if you are not doing well or get worse. Document Released: 03/07/2013 Document Reviewed: 03/07/2013 [facility] Patient Information 2015 [place]. This information is not intended to replace advice given to you by your health care provider. Make sure you discuss any questions you have with your health care provider.   Emergency Department Resource Guide 1) Find a Doctor and Pay Out of Pocket Although you won't have to find out who is covered by your insurance plan, it is a good idea to ask around and get recommendations. You will then need to call the office and see if the doctor you have chosen will accept you as a new patient and what types of options they offer for patients who are self-pay. Some doctors offer discounts or will set up payment plans for their patients who do not have insurance, but you will need to ask so you aren't surprised when you get to your appointment.  2) Contact Your Local Health Department Not all health departments have doctors that can see patients for sick visits, but many do, so it is worth a call to see if yours does. If you don't know where your local  health department is, you can check in your phone book. The CDC also has a tool to help you locate your state's health department, and many state websites also have listings of all of their local health departments.  3) Find a [facility] If your illness is not likely to be very severe or complicated, you may want to try a walk in clinic. These are popping up all over the country in pharmacies, drugstores, and shopping centers. They're usually staffed by nurse practitioners or physician assistants that have been trained to treat common illnesses and complaints. They're usually fairly quick and inexpensive. However, if you have serious medical issues or chronic medical problems, these are probably not your best option.  No Primary Care Doctor: - Call Health Connect at  [phone] - they can help you locate a primary care doctor that  accepts your insurance, provides certain services, etc. - Physician Referral Service- [phone]  Chronic Pain  Problems: Organization         Address  Phone   Notes  [facility]  [phone] Patients need to be referred by their primary care doctor.   Medication Assistance: Organization         Address  Phone   Notes  [facility] Medication [facility] [address] [place], [place] [address] [phone] --Must be a resident of [facility] -- Must have NO insurance coverage whatsoever (no Medicaid/ Medicare, etc.) -- The pt. MUST have a primary care doctor that directs their care regularly and follows them in the community   MedAssist  [phone]   [employer]  [phone]    Agencies that provide inexpensive medical care: Organization         Address  Phone   Notes  [place]  [phone]   [facility]    [phone]   [facility] [place], [place] [address] [phone]   [place] [address] [place] [phone]   Planned Parenthood    [phone]   [facility]    [phone]   [place] and [address], [place] Phone:  [phone], Fax:  [phone] Hours of Operation:  9 am - 6 pm, M-F.  Also accepts Medicaid/Medicare and self-pay.  [facility] for [place], [address], [place] Phone: [phone], Fax: [phone]. Hours of Operation:  8:30 am - 5:30 pm, M-F.  Also accepts Medicaid and self-pay.  [facility] [place] [address], [place] Phone: [phone]   [place], [place], [place] [phone], Ext. [phone] Mondays & Thursdays: 7-9 AM.  First 15 patients are seen on a first come, first serve basis.    [place] Providers:  Organization         Address  Phone   Notes  [facility] [address], [place] [phone] Also accepts self-pay patients.  [facility] [address], [place]  [phone]   [place], [address], [place] [phone]   [facility] [address] [place] [phone]   Lucianne Lei [address], [place]   [phone] Only accepts [place] Access [place] patients after they have their name applied to their card.   Self-Pay (no insurance) in [facility]:  Organization         Address  Phone   Notes  Sickle Cell Patients, [facility] [place] [phone]   [facility]gent Care [place] [phone]   [facility] [place]  [place], [place], [place] [phone]   [facility]/Dr. Osei-Bonsu  [address] [place] or [address], [place] [phone] Phone number for both [place] and  [place] locations is the same.  Urgent Medical and [facility] [address], [place] [phone]   [facility] [address] [place] or [address] [phone]   [facility] [address], [place] [phone], phone; [phone], fax Sees patients 1st  and 3rd Saturday of every month.  Must not qualify for public or private insurance (i.e. Medicaid, Medicare, [place] Health Choice, Veterans' Benefits)  Household income should be no more than 200% of the poverty level The clinic cannot treat you if you are pregnant or think you are pregnant  Sexually transmitted diseases are not treated at the clinic.    Dental Care: Organization         Address  Phone  Notes  [facility] Department of [facility] [place] [phone] Accepts children up to age 64 who are enrolled in [place] or [place]; pregnant women with a Medicaid card; and children who have applied for Medicaid or [place] Health Choice, but were declined, whose parents can pay a reduced fee at time of service.  [facility] Department of [facility]  [address], [place] [phone] Accepts children up to age 14 who are enrolled in [place] or [place]; pregnant women with a Medicaid card; and children who have applied for Medicaid or [place] Health Choice, but were declined, whose parents can pay a reduced fee at time of service.  [place] Adult Dental Access PROGRAM  [place] [phone] Patients are seen by appointment only. Walk-ins are not accepted. [place] will see patients 2 years of age and older. Monday - Tuesday (8am-5pm) Most Wednesdays (8:30-5pm) $30 per visit, cash only  [facility] Adult Dental Access PROGRAM  [address], [facility] [phone] Patients are seen by appointment only. Walk-ins are not accepted.  [place] will see patients 29 years of age and older. One Wednesday Evening (Monthly: Volunteer Based).  $30 per visit, cash only  [place]  [phone] for adults; Children under age 39, call Graduate Pediatric Dentistry at [phone]. Children aged 1-14, please call [phone] to request a pediatric application.  Dental services are provided in all areas of dental care including fillings, crowns and bridges, complete and partial dentures, implants, gum treatment, root canals, and extractions. Preventive care is also provided. Treatment is provided to both adults and children. Patients are selected via a lottery and there is often a waiting list.   [facility] [address], [place]  [phone] [email or web address]   [facility] [address] [place], [place] [phone], Ext. [phone] Second and Fourth Thursday of each month, opens at 6:30 AM; Clinic ends at 9 AM.  Patients are seen on a first-come first-served basis, and a limited number are seen during each clinic.   [facility]  [address] [place]ard Danker [place], [place] [phone]   Eligibility Requirements You must have lived in [place], [place], or [place] counties for at least the last three months.   You cannot be eligible for state or federal sponsored [employer], including [employer], [place], or [employer].   You generally cannot be eligible for healthcare insurance through your employer.    How to apply: Eligibility screenings are held every Tuesday and Wednesday afternoon from 1:00 pm until 4:00 pm. You do not need an appointment for the interview!  [facility] [address] [place], [place]   [facility]  [place] Department  [place]  [phone]    Behavioral Health Resources in the  Community: Intensive Outpatient Programs Organization  Address  Phone  Notes  [place] [address], [place], [place] [phone]   [facility] [address], [place], [place]   ADS: Alcohol & Drug [facility] [address] [place], [place]   [place] [address]  [place], [place] or [phone]   Substance Abuse Resources Organization         Address  Phone  Notes  Alcohol and Drug Services  [phone]   [place]  [phone]   [facility] [place]   Chinita Pester  [phone]   Residential & Outpatient Substance Abuse Program  [phone]   Psychological Services Organization         Address  Phone  Notes  [facility] [place]  [place]  [phone]   [place] [address], [place] or [phone]    Mobile Crisis Teams Organization         Address  Phone  Notes  [facility], Mobile Crisis Care Unit  [phone]   [facility]  [address] [place], [place]   [place] [address] [place] [phone]    Self-Help/Support Groups Organization         Address  Phone             Notes  [place] of [place] - variety of support groups  [place] Call for more information  Narcotics Anonymous (NA), [facility]aring [facility] [address], [employer] [place]  2 meetings at this location   Special educational needs teacher         Address  Phone  Notes  [facility] Residential Treatment [place],    [place]  [phone]   [facility]  [address] [place] [id number], [place], [place]   [place], [place] [phone] Admissions: 8am-3pm M-F  Incentives Substance [place] [phone],    [place], [place] [phone]   [facility] [address] [place], [place], [place]   [facility] [address],  [place], [place]   [facility]ight Programs - Intensive Outpatient [address], [place], [place]   [facility] ([place].,  [place], [place] [phone] or [phone]   [facility] (RTS) [address], [place], [place] Accepts Medicaid  [facility] [place] [address]  [place] [phone] Substance Abuse/Addiction Treatment   [facility] Organization         Address  Phone  Notes  [facility]  [phone]   Domenic Schwab, PhD [address] Arlis [place], [place]   [phone] or [phone]   [place], [place] [phone]   [place] [address], [place], [place] [phone] Insurance/Medicaid/sponsorship through [facility] and Families [address], [address] [place]                                    [place], [place] [phone] [place] [address], [place] [phone]    Dr. Adele Schilder  [phone]   [facility]  [place] Dept[address] 1) [address] [place] 2) [place] 3)  [address], [place] [phone]  [phone]   [place] [phone] or [phone] (After Hours)

## 2014-07-29 NOTE — ED Provider Notes (Signed)
Medical screening examination/treatment/procedure(s) were performed by non-physician practitioner and as supervising physician I was immediately available for consultation/collaboration.   EKG Interpretation None       Adrina Armijo K Linker, MD 07/29/14 1506 

## 2014-07-30 LAB — CULTURE, GROUP A STREP

## 2015-11-13 ENCOUNTER — Emergency Department (HOSPITAL_COMMUNITY)
Admission: EM | Admit: 2015-11-13 | Discharge: 2015-11-13 | Disposition: A | Discharge status: Home / Self Care | Payer: Medicaid Other | Attending: Emergency Medicine | Admitting: Emergency Medicine

## 2015-11-13 ENCOUNTER — Encounter (HOSPITAL_COMMUNITY): Payer: Self-pay

## 2015-11-13 ENCOUNTER — Emergency Department (HOSPITAL_COMMUNITY): Payer: Medicaid Other

## 2015-11-13 DIAGNOSIS — Z792 Long term (current) use of antibiotics: Secondary | ICD-10-CM | POA: Insufficient documentation

## 2015-11-13 DIAGNOSIS — R197 Diarrhea, unspecified: Secondary | ICD-10-CM | POA: Diagnosis not present

## 2015-11-13 DIAGNOSIS — Z3202 Encounter for pregnancy test, result negative: Secondary | ICD-10-CM | POA: Insufficient documentation

## 2015-11-13 DIAGNOSIS — K5900 Constipation, unspecified: Secondary | ICD-10-CM | POA: Diagnosis not present

## 2015-11-13 DIAGNOSIS — R1033 Periumbilical pain: Secondary | ICD-10-CM | POA: Diagnosis present

## 2015-11-13 DIAGNOSIS — Z8739 Personal history of other diseases of the musculoskeletal system and connective tissue: Secondary | ICD-10-CM | POA: Diagnosis not present

## 2015-11-13 LAB — CBC WITH DIFFERENTIAL/PLATELET
Basophils Absolute: 0 10*3/uL (ref 0.0–0.1)
Basophils Relative: 0 %
Eosinophils Absolute: 0.1 10*3/uL (ref 0.0–0.7)
Eosinophils Relative: 1 %
HCT: 39.4 % (ref 36.0–46.0)
Hemoglobin: 12.8 g/dL (ref 12.0–15.0)
Lymphocytes Relative: 42 %
Lymphs Abs: 2.6 10*3/uL (ref 0.7–4.0)
MCH: 26.6 pg (ref 26.0–34.0)
MCHC: 32.5 g/dL (ref 30.0–36.0)
MCV: 81.9 fL (ref 78.0–100.0)
MONO ABS: 0.5 10*3/uL (ref 0.1–1.0)
Monocytes Relative: 9 %
NEUTROS ABS: 3 10*3/uL (ref 1.7–7.7)
Neutrophils Relative %: 48 %
PLATELETS: 237 10*3/uL (ref 150–400)
RBC: 4.81 MIL/uL (ref 3.87–5.11)
RDW: 12.8 % (ref 11.5–15.5)
WBC: 6.2 10*3/uL (ref 4.0–10.5)

## 2015-11-13 LAB — URINALYSIS, ROUTINE W REFLEX MICROSCOPIC
BILIRUBIN URINE: NEGATIVE
Glucose, UA: NEGATIVE mg/dL
Hgb urine dipstick: NEGATIVE
KETONES UR: NEGATIVE mg/dL
Leukocytes, UA: NEGATIVE
Nitrite: NEGATIVE
PROTEIN: NEGATIVE mg/dL
Specific Gravity, Urine: 1.026 (ref 1.005–1.030)
pH: 6.5 (ref 5.0–8.0)

## 2015-11-13 LAB — I-STAT CHEM 8, ED
BUN: 8 mg/dL (ref 6–20)
Calcium, Ion: 1.22 mmol/L (ref 1.12–1.23)
Chloride: 102 mmol/L (ref 101–111)
Creatinine, Ser: 0.9 mg/dL (ref 0.44–1.00)
GLUCOSE: 89 mg/dL (ref 65–99)
HCT: 45 % (ref 36.0–46.0)
Hemoglobin: 15.3 g/dL — ABNORMAL HIGH (ref 12.0–15.0)
Potassium: 3.7 mmol/L (ref 3.5–5.1)
Sodium: 141 mmol/L (ref 135–145)
TCO2: 27 mmol/L (ref 0–100)

## 2015-11-13 LAB — LIPASE, BLOOD: LIPASE: 32 U/L (ref 11–51)

## 2015-11-13 LAB — POC URINE PREG, ED: Preg Test, Ur: NEGATIVE

## 2015-11-13 MED ORDER — ONDANSETRON 4 MG PO TBDP
4.0000 mg | ORAL_TABLET | Freq: Once | ORAL | Status: AC
  Administered 2015-11-13: 4 mg via ORAL
  Filled 2015-11-13: qty 1

## 2015-11-13 MED ORDER — POLYETHYLENE GLYCOL 3350 17 GM/SCOOP PO POWD: 17.0000 g | Freq: Every day | ORAL | Status: DC

## 2015-11-13 NOTE — Discharge Instructions (Signed)
Constipation, Adult  Constipation is when a person has fewer than three bowel movements a week, has difficulty having a bowel movement, or has stools that are dry, hard, or larger than normal. As people grow older, constipation is more common. A low-fiber diet, not taking in enough fluids, and taking certain medicines may make constipation worse.   CAUSES   · Certain medicines, such as antidepressants, pain medicine, iron supplements, antacids, and water pills.    · Certain diseases, such as diabetes, irritable bowel syndrome (IBS), thyroid disease, or depression.    · Not drinking enough water.    · Not eating enough fiber-rich foods.    · Stress or travel.    · Lack of physical activity or exercise.    · Ignoring the urge to have a bowel movement.    · Using laxatives too much.    SIGNS AND SYMPTOMS   · Having fewer than three bowel movements a week.    · Straining to have a bowel movement.    · Having stools that are hard, dry, or larger than normal.    · Feeling full or bloated.    · Pain in the lower abdomen.    · Not feeling relief after having a bowel movement.    DIAGNOSIS   Your health care provider will take a medical history and perform a physical exam. Further testing may be done for severe constipation. Some tests may include:  · A barium enema X-ray to examine your rectum, colon, and, sometimes, your small intestine.    · A sigmoidoscopy to examine your lower colon.    · A colonoscopy to examine your entire colon.  TREATMENT   Treatment will depend on the severity of your constipation and what is causing it. Some dietary treatments include drinking more fluids and eating more fiber-rich foods. Lifestyle treatments may include regular exercise. If these diet and lifestyle recommendations do not help, your health care provider may recommend taking over-the-counter laxative medicines to help you have bowel movements. Prescription medicines may be prescribed if over-the-counter medicines do not work.      HOME CARE INSTRUCTIONS   · Eat foods that have a lot of fiber, such as fruits, vegetables, whole grains, and beans.  · Limit foods high in fat and processed sugars, such as french fries, hamburgers, cookies, candies, and soda.    · A fiber supplement may be added to your diet if you cannot get enough fiber from foods.    · Drink enough fluids to keep your urine clear or pale yellow.    · Exercise regularly or as directed by your health care provider.    · Go to the restroom when you have the urge to go. Do not hold it.    · Only take over-the-counter or prescription medicines as directed by your health care provider. Do not take other medicines for constipation without talking to your health care provider first.    SEEK IMMEDIATE MEDICAL CARE IF:   · You have bright red blood in your stool.    · Your constipation lasts for more than 4 days or gets worse.    · You have abdominal or rectal pain.    · You have thin, pencil-like stools.    · You have unexplained weight loss.  MAKE SURE YOU:   · Understand these instructions.  · Will watch your condition.  · Will get help right away if you are not doing well or get worse.     This information is not intended to replace advice given to you by your health care provider. Make sure you discuss any questions   you have with your health care provider.     Document Released: 08/08/2004 Document Revised: 12/01/2014 Document Reviewed: 08/22/2013  Elsevier Interactive Patient Education ©2016 Elsevier Inc.  Diet for Irritable Bowel Syndrome  When you have irritable bowel syndrome (IBS), the foods you eat and your eating habits are very important. IBS may cause various symptoms, such as abdominal pain, constipation, or diarrhea. Choosing the right foods can help ease discomfort caused by these symptoms. Work with your health care provider and dietitian to find the best eating plan to help control your symptoms.  WHAT GENERAL GUIDELINES DO I NEED TO FOLLOW?  · Keep a food diary. This  will help you identify foods that cause symptoms. Write down:    What you eat and when.    What symptoms you have.    When symptoms occur in relation to your meals.  · Avoid foods that cause symptoms. Talk with your dietitian about other ways to get the same nutrients that are in these foods.  · Eat more foods that contain fiber. Take a fiber supplement if directed by your dietitian.  · Eat your meals slowly, in a relaxed setting.  · Aim to eat 5-6 small meals per day. Do not skip meals.  · Drink enough fluids to keep your urine clear or pale yellow.  · Ask your health care provider if you should take an over-the-counter probiotic during flare-ups to help restore healthy gut bacteria.  · If you have cramping or diarrhea, try making your meals low in fat and high in carbohydrates. Examples of carbohydrates are pasta, rice, whole grain breads and cereals, fruits, and vegetables.  · If dairy products cause your symptoms to flare up, try eating less of them. You might be able to handle yogurt better than other dairy products because it contains bacteria that help with digestion.  WHAT FOODS ARE NOT RECOMMENDED?  The following are some foods and drinks that may worsen your symptoms:  · Fatty foods, such as French fries.  · Milk products, such as cheese or ice cream.  · Chocolate.  · Alcohol.  · Products with caffeine, such as coffee.  · Carbonated drinks, such as soda.  The items listed above may not be a complete list of foods and beverages to avoid. Contact your dietitian for more information.  WHAT FOODS ARE GOOD SOURCES OF FIBER?  Your health care provider or dietitian may recommend that you eat more foods that contain fiber. Fiber can help reduce constipation and other IBS symptoms. Add foods with fiber to your diet a little at a time so that your body can get used to them. Too much fiber at once might cause gas and swelling of your abdomen. The following are some foods that are good sources of  fiber:  · Apples.  · Peaches.  · Pears.  · Berries.  · Figs.  · Broccoli (raw).  · Cabbage.  · Carrots.  · Raw peas.  · Kidney beans.  · Lima beans.  · Whole grain bread.  · Whole grain cereal.  FOR MORE INFORMATION   International Foundation for Functional Gastrointestinal Disorders: www.iffgd.org  National Institute of Diabetes and Digestive and Kidney Diseases: www.niddk.nih.gov/health-information/health-topics/digestive-diseases/ibs/Pages/facts.aspx     This information is not intended to replace advice given to you by your health care provider. Make sure you discuss any questions you have with your health care provider.     Document Released: 01/31/2004 Document Revised: 12/01/2014 Document Reviewed: 02/10/2014  Elsevier Interactive Patient   Education ©2016 Elsevier Inc.

## 2015-11-13 NOTE — ED Notes (Signed)
Pt provided with work note, per request

## 2015-11-13 NOTE — ED Notes (Signed)
Patient here with abdominal pain with back pain x 1 week. Nausea for same without vomiting.

## 2015-11-13 NOTE — ED Provider Notes (Signed)
CSN: [id number]     Arrival date & time 11/13/15  1344 History  By signing my name below, I, Lindsey Moon, attest that this documentation has been prepared under the direction and in the presence of Lindsey BerryLeisa Tredarius Cobern, PA-C.  Electronically Signed: Ronney LionSuzanne Moon, ED Scribe. 11/13/2015. 6:04 PM.   Chief Complaint  Patient presents with  . Abdominal Pain   The history is provided by the patient. No language interpreter was used.    HPI Comments: Lindsey Moon is a 19 y.o. female with a history of scoliosis, who presents to the Emergency Department complaining of intermittent, sharp, worsening periumbilical abdominal pain that she characterizes as feeling like having too much gas, with onset 1 week ago. She reports her abdominal pain was initially intermittent but became constant in the past day. She also complains of intermittent, mild nausea and diarrhea  throughout last week but she states that this is "normal for her". She denies vomiting, hematemesis, melena, hematochezia, chest pain, shortness of breath, fever, chills, sweats.  She denies any recent travel or any new foods.  She states that eating does not worsen her pain and it comes and goes without any aggravating factors. She saw her PCP for this earlier in the week and was given a liquid medication that she is unable to specify (starts with a "C" and is pink and taste like bubblegum, which provided mild relief.  She denies a history of any EtOH consumption, daily NSAID use.  She denies any urinary or vaginal symptoms.  PCP: [facility]  Past Medical History  Diagnosis Date  . Scoliosis    History reviewed. No pertinent past surgical history. Family History  Problem Relation Age of Onset  . Diabetes Mother   . Hypertension Mother    Social History  Substance Use Topics  . Smoking status: Never Smoker   . Smokeless tobacco: None  . Alcohol Use: No   OB History    No data available     Review of Systems  Respiratory:  Negative.  Negative for cough, chest tightness, shortness of breath and wheezing.   Cardiovascular: Negative.   Gastrointestinal: Positive for nausea and abdominal pain. Negative for vomiting.  Genitourinary: Negative for dysuria, urgency, frequency, hematuria, flank pain, decreased urine volume, vaginal bleeding, vaginal discharge, difficulty urinating, vaginal pain, menstrual problem and pelvic pain.  Musculoskeletal: Negative.   Skin: Negative.   Neurological: Negative.   Hematological: Negative.   Psychiatric/Behavioral: Negative.   All other systems reviewed and are negative.  Allergies  Review of patient's allergies indicates no known allergies.  Home Medications   Prior to Admission medications   Medication Sig Start Date End Date Taking? Authorizing Provider  azithromycin (ZITHROMAX) 250 MG tablet Take 1 tablet (250 mg total) by mouth daily. Take first 2 tablets together, then 1 every day until finished. 07/28/14   Marissa Sciacca, PA-C  polyethylene glycol powder (GLYCOLAX/MIRALAX) powder Take 17 g by mouth daily. 11/13/15   Lindsey BerryLeisa Nuel Dejaynes, PA-C   BP 110/52 mmHg  Pulse 85  Temp(Src) 98.8 F (37.1 C) (Oral)  Resp 14  SpO2 100% Physical Exam  Constitutional: She is oriented to person, place, and time. She appears well-developed and well-nourished. No distress.  HENT:  Head: Normocephalic and atraumatic.  Nose: Nose normal.  Mouth/Throat: Oropharynx is clear and moist. No oropharyngeal exudate.  Eyes: Conjunctivae and EOM are normal. Pupils are equal, round, and reactive to light. Right eye exhibits no discharge. Left eye exhibits no discharge. No scleral  icterus.  Neck: Normal range of motion. Neck supple. No JVD present. No tracheal deviation present. No thyromegaly present.  Cardiovascular: Normal rate, regular rhythm, normal heart sounds and intact distal pulses.  Exam reveals no gallop and no friction rub.   No murmur heard. Pulmonary/Chest: Effort normal and breath sounds  normal. No respiratory distress. She has no wheezes. She has no rales. She exhibits no tenderness.  Abdominal: Soft. Normal appearance. She exhibits no distension and no mass. There is no tenderness. There is no rigidity, no rebound, no guarding, no CVA tenderness, no tenderness at McBurney's point and negative Murphy's sign.  Hypoactive bowel sounds. No TTP, guarding, or rebound.   Musculoskeletal: Normal range of motion. She exhibits no edema or tenderness.  Lymphadenopathy:    She has no cervical adenopathy.  Neurological: She is alert and oriented to person, place, and time. She has normal reflexes. No cranial nerve deficit. She exhibits normal muscle tone. Coordination normal.  Skin: Skin is warm and dry. No rash noted. She is not diaphoretic. No erythema. No pallor.  Psychiatric: She has a normal mood and affect. Her behavior is normal. Judgment and thought content normal.  Nursing note and vitals reviewed.   ED Course  Procedures (including critical care time)  DIAGNOSTIC STUDIES: Oxygen Saturation is 100% on RA, normal by my interpretation.    COORDINATION OF CARE: 4:49 PM - Pt made aware of lab test results. Discussed treatment plan with pt at bedside which includes abdominal XR. Pt verbalized understanding and agreed to plan.   Labs Review Labs Reviewed  URINALYSIS, ROUTINE W REFLEX MICROSCOPIC (NOT AT [facility]) - Abnormal; Notable for the following:    APPearance CLOUDY (*)    All other components within normal limits  I-STAT CHEM 8, ED - Abnormal; Notable for the following:    Hemoglobin 15.3 (*)    All other components within normal limits  CBC WITH DIFFERENTIAL/PLATELET  LIPASE, BLOOD  POC URINE PREG, ED    Imaging Review Dg Abd Acute W/chest  11/13/2015  CLINICAL DATA:  Umbilical abdominal pain. Nausea and diarrhea for 1 week. EXAM: DG ABDOMEN ACUTE W/ 1V CHEST COMPARISON:  Chest radiograph -9/4/ 2015; 06/06/2014 FINDINGS: Grossly unchanged cardiac silhouette and  mediastinal contours. No focal parenchymal opacities. No pleural effusion or pneumothorax. No evidence of edema. Moderate colonic stool burden. There is mild gas distention of the splenic flexure of the colon, similar to prior chest radiograph performed 07/2014. Otherwise, there is a paucity of bowel gas. No pneumoperitoneum, pneumatosis or portal venous gas. No definite abnormal intra-abdominal calcifications. No acute osseus abnormalities. IMPRESSION: 1. No acute cardiopulmonary disease. 2. Moderate colonic stool burden. 3. Persistent moderate gas distention of the colon without evidence of enteric obstruction. Electronically Signed   By: Simonne Come M.D.   On: 11/13/2015 18:27   I have personally reviewed and evaluated these images and lab results as part of my medical decision-making.  MDM   Patient with approximately 1 week of abdominal pain and her mid central abdomen described as sharp and shooting and "like gas pains"  she has been treated by her doctor apparently with a pill and with Carafate, but she states that she has not had any improvement. She states she has had looser stools recently. Otherwise has mild nausea but no vomiting, no fever.  At the time of my evaluation labs from triage had been resulted which were unremarkable.   Abdominal exam was benign she was nontender, without guarding and without reboundk, slightly  hypoactive bowel sounds. Acute abdomen series obtained, which was positive for moderate colonic stool burden and moderate gas but negative for obstruction.  The patient was at a being treated by her primary care provider for the same symptoms. She reports being on Carafate and a PPI. Will give MiraLAX for constipation and discharged home. Patient's vital signs are stable throughout her stay in the ER.   Final diagnoses:  Constipation, unspecified constipation type    I personally performed the services described in this documentation, which was scribed in my presence.  The recorded information has been reviewed and is accurate.       Lindsey Berry, PA-C 11/13/15 1845  Lindsey Berry, PA-C 11/13/15 1856  Lavera Guise, MD 11/14/15 0030

## 2015-12-19 ENCOUNTER — Other Ambulatory Visit: Payer: Self-pay | Admitting: *Deleted

## 2015-12-19 ENCOUNTER — Other Ambulatory Visit: Payer: Self-pay | Admitting: Neurology

## 2015-12-19 ENCOUNTER — Encounter: Payer: Self-pay | Admitting: *Deleted

## 2015-12-19 ENCOUNTER — Ambulatory Visit (INDEPENDENT_AMBULATORY_CARE_PROVIDER_SITE_OTHER): Payer: Medicaid Other | Admitting: Neurology

## 2015-12-19 ENCOUNTER — Encounter: Payer: Self-pay | Admitting: Neurology

## 2015-12-19 VITALS — BP 128/76 | HR 95 | Ht 63.0 in | Wt 230.0 lb

## 2015-12-19 DIAGNOSIS — M545 Low back pain: Secondary | ICD-10-CM | POA: Diagnosis not present

## 2015-12-19 DIAGNOSIS — M2669 Other specified disorders of temporomandibular joint: Secondary | ICD-10-CM | POA: Diagnosis not present

## 2015-12-19 DIAGNOSIS — M549 Dorsalgia, unspecified: Secondary | ICD-10-CM | POA: Insufficient documentation

## 2015-12-19 DIAGNOSIS — G43709 Chronic migraine without aura, not intractable, without status migrainosus: Secondary | ICD-10-CM | POA: Diagnosis not present

## 2015-12-19 DIAGNOSIS — M26649 Arthritis of unspecified temporomandibular joint: Secondary | ICD-10-CM

## 2015-12-19 DIAGNOSIS — IMO0002 Reserved for concepts with insufficient information to code with codable children: Secondary | ICD-10-CM

## 2015-12-19 MED ORDER — TOPIRAMATE 50 MG PO TABS: ORAL_TABLET | ORAL | Status: DC

## 2015-12-19 MED ORDER — RIZATRIPTAN BENZOATE 5 MG PO TBDP: 5.0000 mg | ORAL_TABLET | ORAL | Status: DC | PRN

## 2015-12-19 NOTE — Progress Notes (Signed)
PATIENT: Lindsey Moon DOB: [birth year]  Chief Complaint  Patient presents with  . Chronic Back Pain    She was diagnosed with mild scoliosis in fifth grade.  She has noticed worsening of pain 3-4 weeks ago.  Pain is from her left scapular region to her low back.    . Temporomandibular Joint Pain    Her current flare up has been present for the last week, causing her frequent headaches.       HISTORICAL  Lindsey Moon is a 20 years old right-handed female, seen in refer by primary care nurse practitioner Norm Salt in December 19 2015 for evaluation of upper back pain, frequent headaches.  I reviewed and summarized the referral from her primary care physician, she had a history of allergic rhinitis, obesity, acid reflux disease  She complains of low back pain since age 45, with diagnosis of mild scoliosis, over the years, she had gradually worsening low back pain, moving at different spots, also to the upper back, she denies radiating pain to bilateral lower extremity, she denies neck pain, she denies radiating pain to bilateral upper extremity, she denies upper extremity paresthesia or weakness, she denies gait difficulty no bowel and bladder incontinence, she has been taking intermittent ibuprofen, which helpful, she does not exercise regularly, does has obesity, rapid weight gain over the past few years, especially after she was started on Depo-Provera shots as contraceptives  She also complains of intermittent left TMJ joints pain, recent flare since January 2017, popping sounds when she open her mouth wide, bilateral hard object,    She has long-standing history of migraine headaches getting worse over the past few years, she has headache 2-3 times every months, bilateral frontal temporal severe pounding headache with associated light noise sensitivity, triggered by stress, hungry, noise, smell, weather changes, her headache last about few hours to couple days,  ibuprofen usually take care of her headaches, occasionally preceded by a visual aura  Laboratory from December 17 2015 showed normal CMP, with creatinine of 0.9 1, GFR of 91, normal CBC with hemoglobin of 13 point 4, A1c was mild elevated 5.9, LDL was 64, cholesterol was 117.   REVIEW OF SYSTEMS: Full 14 system review of systems performed and notable only for snoring, headaches, decreased energy, achy muscles, allergy, constipation, chest pain, weight gain   ALLERGIES: No Known Allergies  HOME MEDICATIONS: Current Outpatient Prescriptions  Medication Sig Dispense Refill  . fluticasone (FLONASE) 50 MCG/ACT nasal spray Place into both nostrils as needed for allergies or rhinitis.    Marland Kitchen ibuprofen (ADVIL,MOTRIN) 800 MG tablet Take 800 mg by mouth every 8 (eight) hours as needed.    . METHOCARBAMOL PO Take by mouth as needed.    . traMADol (ULTRAM) 50 MG tablet Take by mouth every 6 (six) hours as needed.     No current facility-administered medications for this visit.    PAST MEDICAL HISTORY: Past Medical History  Diagnosis Date  . Scoliosis   . TMJ disease   . Headache   . GERD (gastroesophageal reflux disease)   . Obesity   . Allergic rhinitis     PAST SURGICAL HISTORY: Past Surgical History  Procedure Laterality Date  . Arm surgery Left     FAMILY HISTORY: Family History  Problem Relation Age of Onset  . Diabetes Mother   . Hypertension Mother     SOCIAL HISTORY:  Social History   Social History  . Marital Status: Single  Spouse Name: N/A  . Number of Children: 0  . Years of Education: In college   Occupational History  . Student    Social History Main Topics  . Smoking status: Never Smoker   . Smokeless tobacco: Not on file  . Alcohol Use: No  . Drug Use: No  . Sexual Activity: No   Other Topics Concern  . Not on file   Social History Narrative   Lives at home home with her mother.   Right-handed.   Drinks 2-4 sodas per day.     PHYSICAL  EXAM   Filed Vitals:   12/19/15 1404  BP: 128/76  Pulse: 95  Height:  (1.6 m)  Weight: 230 lb (104.327 kg)    Not recorded      Body mass index is 40.75 kg/(m^2).  PHYSICAL EXAMNIATION:  Gen: NAD, conversant, well nourised, obese, well groomed                     Cardiovascular: Regular rate rhythm, no peripheral edema, warm, nontender. Eyes: Conjunctivae clear without exudates or hemorrhage Neck: Supple, no carotid bruise. Pulmonary: Clear to auscultation bilaterally   NEUROLOGICAL EXAM:  MENTAL STATUS: Speech:    Speech is normal; fluent and spontaneous with normal comprehension.  Cognition:     Orientation to time, place and person     Normal recent and remote memory     Normal Attention span and concentration     Normal Language, naming, repeating,spontaneous speech     Fund of knowledge   CRANIAL NERVES: CN II: Visual fields are full to confrontation. Fundoscopic exam is normal with sharp discs and no vascular changes. Pupils are round equal and briskly reactive to light. CN III, IV, VI: extraocular movement are normal. No ptosis. CN V: Facial sensation is intact to pinprick in all 3 divisions bilaterally. Corneal responses are intact.  CN VII: Face is symmetric with normal eye closure and smile. CN VIII: Hearing is normal to rubbing fingers CN IX, X: Palate elevates symmetrically. Phonation is normal. CN XI: Head turning and shoulder shrug are intact CN XII: Tongue is midline with normal movements and no atrophy.  MOTOR: There is no pronator drift of out-stretched arms. Muscle bulk and tone are normal. Muscle strength is normal.  REFLEXES: Reflexes are 2+ and symmetric at the biceps, triceps, knees, and ankles. Plantar responses are flexor.  SENSORY: Intact to light touch, pinprick, position sense, and vibration sense are intact in fingers and toes.  COORDINATION: Rapid alternating movements and fine finger movements are intact. There is no dysmetria  on finger-to-nose and heel-knee-shin.    GAIT/STANCE: Posture is normal. Gait is steady with normal steps, base, arm swing, and turning. Heel and toe walking are normal. Tandem gait is normal.  Romberg is absent.   DIAGNOSTIC DATA (LABS, IMAGING, TESTING) - I reviewed patient records, labs, notes, testing and imaging myself where available.   ASSESSMENT AND PLAN  TARONDA COMACHO is a 20 y.o. female    chronic migraine  Topamax 50 mg titrating to 2 tablets twice a day as migraine prevention   Maxalt as needed  obesity   chronic low back pain  Most consistent with musculoskeletal etiology  I encouraged her back stretching, moderate exercise daily, heating pad, as needed NSAIDs,    Levert Feinstein, M.D. Ph.D.  [facility] [address] [place], [place] [address] Ph: [phone] Fax: [phone]  CC: Norm Salt, [place]

## 2015-12-20 ENCOUNTER — Ambulatory Visit: Payer: Self-pay | Admitting: Certified Nurse Midwife

## 2015-12-21 ENCOUNTER — Encounter: Payer: Self-pay | Admitting: Certified Nurse Midwife

## 2015-12-21 ENCOUNTER — Ambulatory Visit (INDEPENDENT_AMBULATORY_CARE_PROVIDER_SITE_OTHER): Payer: Medicaid Other | Admitting: Certified Nurse Midwife

## 2015-12-21 VITALS — BP 114/76 | HR 94 | Wt 230.0 lb

## 2015-12-21 DIAGNOSIS — Z0000 Encounter for general adult medical examination without abnormal findings: Secondary | ICD-10-CM

## 2015-12-21 DIAGNOSIS — Z30013 Encounter for initial prescription of injectable contraceptive: Secondary | ICD-10-CM

## 2015-12-21 DIAGNOSIS — Z01419 Encounter for gynecological examination (general) (routine) without abnormal findings: Secondary | ICD-10-CM

## 2015-12-21 MED ORDER — DOCUSATE SODIUM 100 MG PO CAPS: 100.0000 mg | ORAL_CAPSULE | Freq: Two times a day (BID) | ORAL | Status: DC

## 2015-12-21 MED ORDER — OMEPRAZOLE 20 MG PO CPDR: 20.0000 mg | DELAYED_RELEASE_CAPSULE | Freq: Two times a day (BID) | ORAL | Status: DC

## 2015-12-21 NOTE — Progress Notes (Signed)
Patient ID: Lindsey Moon, female   DOB: 02-15-1996, 20 y.o.   MRN: [id number]    Subjective:      Lindsey Moon is a 20 y.o. female here for a routine exam.  Current complaints: none.  [facility] to [facility] the [facility] and was on the depo injection.  Currently sexually active.  Uses condoms.  Denies any periods, on Depo.  Last injection was in November in her abdomen.  Is having side effects from Depo of weight gain, mood swings.  Has been on Depo since 2014.  Previous to Depo: periods were lasting 2 weeks.  Started periods at 20 years of age.    Is in college, lives with her sister.   Is here from [place].  Reports constipation with bowel movements.  Reports occasional chest pain with eating.  Declines blood STD screening.    Personal health questionnaire:  Is patient Lindsey Moon, have a family history of breast and/or ovarian cancer: no Is there a family history of uterine cancer diagnosed at age < 39, gastrointestinal cancer, urinary tract cancer, family member who is a Personnel officer syndrome-associated carrier: no Is the patient overweight and hypertensive, family history of diabetes, personal history of gestational diabetes, preeclampsia or PCOS: yes Is patient over 74, have PCOS,  family history of premature CHD under age 64, diabetes, smoke, have hypertension or peripheral artery disease:  yes At any time, has a partner hit, kicked or otherwise hurt or frightened you?: no Over the past 2 weeks, have you felt down, depressed or hopeless?: no Over the past 2 weeks, have you felt little interest or pleasure in doing things?:no   Gynecologic History No LMP recorded. Patient has had an injection. Contraception: Depo-Provera injections   Obstetric History OB History  Gravida Para Term Preterm AB SAB TAB Ectopic Multiple Living         Past Medical History  Diagnosis Date  . Scoliosis   . TMJ disease   . Headache   . GERD (gastroesophageal reflux  disease)   . Obesity   . Allergic rhinitis     Past Surgical History  Procedure Laterality Date  . Arm surgery Left      Current outpatient prescriptions:  .  fluticasone (FLONASE) 50 MCG/ACT nasal spray, Place into both nostrils as needed for allergies or rhinitis., Disp: , Rfl:  .  ibuprofen (ADVIL,MOTRIN) 800 MG tablet, Take 800 mg by mouth every 8 (eight) hours as needed., Disp: , Rfl:  .  docusate sodium (COLACE) 100 MG capsule, Take 1 capsule (100 mg total) by mouth 2 (two) times daily., Disp: 90 capsule, Rfl: 1 .  METHOCARBAMOL PO, Take by mouth as needed. Reported on 12/21/2015, Disp: , Rfl:  .  omeprazole (PRILOSEC) 20 MG capsule, Take 1 capsule (20 mg total) by mouth 2 (two) times daily before a meal., Disp: 60 capsule, Rfl: 12 .  rizatriptan (MAXALT-MLT) 5 MG disintegrating tablet, Take 1 tablet (5 mg total) by mouth as needed. May repeat in 2 hours if needed (Patient not taking: Reported on 12/21/2015), Disp: 15 tablet, Rfl: 6 .  topiramate (TOPAMAX) 50 MG tablet, One po bid xone week, then 2 tabs po bid (Patient not taking: Reported on 12/21/2015), Disp: 120 tablet, Rfl: 11 .  traMADol (ULTRAM) 50 MG tablet, Take by mouth every 6 (six) hours as needed. Reported on 12/21/2015, Disp: , Rfl:  No Known Allergies  Social History  Substance  Use Topics  . Smoking status: Never Smoker   . Smokeless tobacco: Not on file  . Alcohol Use: No    Family History  Problem Relation Age of Onset  . Diabetes Mother   . Hypertension Mother   . Mental illness Mother       Review of Systems  Constitutional: negative for fatigue and weight loss Respiratory: negative for cough and wheezing Cardiovascular: negative for chest pain, fatigue and palpitations Gastrointestinal: negative for abdominal pain and change in bowel habits, + constipation, +GERD Musculoskeletal:negative for myalgias Neurological: negative for gait problems and tremors Behavioral/Psych: negative for abusive relationship,  depression Endocrine: negative for temperature intolerance   Genitourinary:negative for abnormal menstrual periods, genital lesions, hot flashes, sexual problems and vaginal discharge Integument/breast: negative for breast lump, breast tenderness, nipple discharge and skin lesion(s)    Objective:       BP 114/76 mmHg  Pulse 94  Wt 230 lb (104.327 kg) General:   alert  Skin:   no rash or abnormalities  Lungs:   clear to auscultation bilaterally  Heart:   regular rate and rhythm, S1, S2 normal, no murmur, click, rub or gallop  Breasts:   normal without suspicious masses, skin or nipple changes or axillary nodes  Abdomen:  normal findings: no organomegaly, soft, non-tender and no hernia  Pelvis:  External genitalia: normal general appearance Urinary system: urethral meatus normal and bladder without fullness, nontender Vaginal: normal without tenderness, induration or masses Cervix: normal appearance Adnexa: normal bimanual exam Uterus: anteverted and non-tender, normal size   Lab Review Urine pregnancy test Labs reviewed yes Radiologic studies reviewed no  50% of 30 min visit spent on counseling and coordination of care.   Assessment:    Healthy female exam.   Contraception management  Plan:    Education reviewed: calcium supplements, depression evaluation, low fat, low cholesterol diet, safe sex/STD prevention, self breast exams, skin cancer screening and weight bearing exercise. Contraception: Depo-Provera injections. Follow up in: 3 months for depo injections.   Meds ordered this encounter  Medications  . docusate sodium (COLACE) 100 MG capsule    Sig: Take 1 capsule (100 mg total) by mouth 2 (two) times daily.    Dispense:  90 capsule    Refill:  1  . omeprazole (PRILOSEC) 20 MG capsule    Sig: Take 1 capsule (20 mg total) by mouth 2 (two) times daily before a meal.    Dispense:  60 capsule    Refill:  12   No orders of the defined types were placed in this  encounter.   Need to obtain previous records Possible management options include: Lindsey Moon

## 2016-01-12 ENCOUNTER — Emergency Department (HOSPITAL_COMMUNITY): Payer: Medicaid Other

## 2016-01-12 ENCOUNTER — Encounter (HOSPITAL_COMMUNITY): Payer: Self-pay | Admitting: *Deleted

## 2016-01-12 ENCOUNTER — Emergency Department (HOSPITAL_COMMUNITY)
Admission: EM | Admit: 2016-01-12 | Discharge: 2016-01-12 | Disposition: A | Discharge status: Home / Self Care | Payer: Medicaid Other | Attending: Emergency Medicine | Admitting: Emergency Medicine

## 2016-01-12 DIAGNOSIS — Z3202 Encounter for pregnancy test, result negative: Secondary | ICD-10-CM | POA: Insufficient documentation

## 2016-01-12 DIAGNOSIS — Z79899 Other long term (current) drug therapy: Secondary | ICD-10-CM | POA: Insufficient documentation

## 2016-01-12 DIAGNOSIS — K219 Gastro-esophageal reflux disease without esophagitis: Secondary | ICD-10-CM | POA: Insufficient documentation

## 2016-01-12 DIAGNOSIS — K5900 Constipation, unspecified: Secondary | ICD-10-CM | POA: Insufficient documentation

## 2016-01-12 DIAGNOSIS — M419 Scoliosis, unspecified: Secondary | ICD-10-CM | POA: Diagnosis not present

## 2016-01-12 DIAGNOSIS — E669 Obesity, unspecified: Secondary | ICD-10-CM | POA: Diagnosis not present

## 2016-01-12 DIAGNOSIS — R1084 Generalized abdominal pain: Secondary | ICD-10-CM | POA: Diagnosis present

## 2016-01-12 LAB — COMPREHENSIVE METABOLIC PANEL
ALK PHOS: 90 U/L (ref 38–126)
ALT: 18 U/L (ref 14–54)
AST: 17 U/L (ref 15–41)
Albumin: 3.5 g/dL (ref 3.5–5.0)
Anion gap: 9 (ref 5–15)
BUN: 10 mg/dL (ref 6–20)
CALCIUM: 9.4 mg/dL (ref 8.9–10.3)
CHLORIDE: 108 mmol/L (ref 101–111)
CO2: 24 mmol/L (ref 22–32)
CREATININE: 1.04 mg/dL — AB (ref 0.44–1.00)
GFR calc non Af Amer: >60 mL/min (ref >60)
GLUCOSE: 94 mg/dL (ref 65–99)
Potassium: 4 mmol/L (ref 3.5–5.1)
SODIUM: 141 mmol/L (ref 135–145)
Total Bilirubin: 0.5 mg/dL (ref 0.3–1.2)
Total Protein: 6.7 g/dL (ref 6.5–8.1)

## 2016-01-12 LAB — CBC
HCT: 39.3 % (ref 36.0–46.0)
Hemoglobin: 12.5 g/dL (ref 12.0–15.0)
MCH: 26.2 pg (ref 26.0–34.0)
MCHC: 31.8 g/dL (ref 30.0–36.0)
MCV: 82.2 fL (ref 78.0–100.0)
PLATELETS: 277 10*3/uL (ref 150–400)
RBC: 4.78 MIL/uL (ref 3.87–5.11)
RDW: 12.9 % (ref 11.5–15.5)
WBC: 6.2 10*3/uL (ref 4.0–10.5)

## 2016-01-12 LAB — URINALYSIS, ROUTINE W REFLEX MICROSCOPIC
Bilirubin Urine: NEGATIVE
Glucose, UA: NEGATIVE mg/dL
Hgb urine dipstick: NEGATIVE
Ketones, ur: NEGATIVE mg/dL
LEUKOCYTES UA: NEGATIVE
NITRITE: NEGATIVE
PH: 6.5 (ref 5.0–8.0)
Protein, ur: NEGATIVE mg/dL
SPECIFIC GRAVITY, URINE: 1.023 (ref 1.005–1.030)

## 2016-01-12 LAB — I-STAT BETA HCG BLOOD, ED (MC, WL, AP ONLY): I-stat hCG, quantitative: <5 m[IU]/mL (ref <5)

## 2016-01-12 LAB — LIPASE, BLOOD: LIPASE: 30 U/L (ref 11–51)

## 2016-01-12 NOTE — Discharge Instructions (Signed)

## 2016-01-12 NOTE — ED Provider Notes (Signed)
CSN: [id number]     Arrival date & time 01/12/16  1414 History   First MD Initiated Contact with Patient 01/12/16 1806     Chief Complaint  Patient presents with  . Emesis  . Abdominal Pain      HPI   20 year old female presents today with complaints of abdominal discomfort. Patient reports that she woke up this morning was not feeling well and had not have a bowel movement in the last 3 days. Patient reports significant past medical history the same reporting constipation, she was discharged home with MiraLAX which improved her symptoms. Patient reports she has not tried MiraLAX. She reports abdominal discomfort is diffuse, no focal findings. She denies any fever, chills, reports one episode of vomiting today. Patient denies any trauma to the abdomen. Patient reports she's been amenable to tolerate by mouth without difficulty.  Past Medical History  Diagnosis Date  . Scoliosis   . TMJ disease   . Headache   . GERD (gastroesophageal reflux disease)   . Obesity   . Allergic rhinitis    Past Surgical History  Procedure Laterality Date  . Arm surgery Left    Family History  Problem Relation Age of Onset  . Diabetes Mother   . Hypertension Mother   . Mental illness Mother    Social History  Substance Use Topics  . Smoking status: Never Smoker   . Smokeless tobacco: None  . Alcohol Use: No   OB History    Gravida Para Term Preterm AB TAB SAB Ectopic Multiple Living       Review of Systems  All other systems reviewed and are negative.   Allergies  Review of patient's allergies indicates no known allergies.  Home Medications   Prior to Admission medications   Medication Sig Start Date End Date Taking? Authorizing Provider  docusate sodium (COLACE) 100 MG capsule Take 1 capsule (100 mg total) by mouth 2 (two) times daily. 12/21/15   Rachelle A Denney, CNM  fluticasone (FLONASE) 50 MCG/ACT nasal spray Place into both nostrils as needed for allergies  or rhinitis.    Historical Provider, MD  ibuprofen (ADVIL,MOTRIN) 800 MG tablet Take 800 mg by mouth every 8 (eight) hours as needed.    Historical Provider, MD  METHOCARBAMOL PO Take by mouth as needed. Reported on 12/21/2015    Historical Provider, MD  omeprazole (PRILOSEC) 20 MG capsule Take 1 capsule (20 mg total) by mouth 2 (two) times daily before a meal. 12/21/15   Rachelle A Denney, CNM  rizatriptan (MAXALT-MLT) 5 MG disintegrating tablet Take 1 tablet (5 mg total) by mouth as needed. May repeat in 2 hours if needed Patient not taking: Reported on 12/21/2015 12/19/15   Levert Feinstein, MD  topiramate (TOPAMAX) 50 MG tablet One po bid xone week, then 2 tabs po bid Patient not taking: Reported on 12/21/2015 12/19/15   Levert Feinstein, MD  traMADol (ULTRAM) 50 MG tablet Take by mouth every 6 (six) hours as needed. Reported on 12/21/2015    Historical Provider, MD   BP 120/73 mmHg  Pulse 83  Temp(Src) 98.2 F (36.8 C) (Oral)  Resp 16  SpO2 99%   Physical Exam  Constitutional: She is oriented to person, place, and time. She appears well-developed and well-nourished.  HENT:  Head: Normocephalic and atraumatic.  Eyes: Conjunctivae are normal. Pupils are equal, round, and reactive to light. Right eye exhibits no discharge. Left eye exhibits no  discharge. No scleral icterus.  Neck: Normal range of motion. No JVD present. No tracheal deviation present.  Cardiovascular: Normal rate, regular rhythm, normal heart sounds and intact distal pulses.  Exam reveals no gallop and no friction rub.   No murmur heard. Pulmonary/Chest: Effort normal. No stridor.  Abdominal: She exhibits no distension and no mass. There is tenderness. There is no rebound and no guarding.  Her minor abdominal tenderness to palpation, this is diffuse, no focal pain  Musculoskeletal: Normal range of motion. She exhibits no edema or tenderness.  Neurological: She is alert and oriented to person, place, and time. Coordination normal.  Skin:  Skin is warm and dry. No rash noted. No erythema. No pallor.  Psychiatric: She has a normal mood and affect. Her behavior is normal. Judgment and thought content normal.  Nursing note and vitals reviewed.   ED Course  Procedures (including critical care time) Labs Review Labs Reviewed  COMPREHENSIVE METABOLIC PANEL - Abnormal; Notable for the following:    Creatinine, Ser 1.04 (*)    All other components within normal limits  LIPASE, BLOOD  CBC  URINALYSIS, ROUTINE W REFLEX MICROSCOPIC (NOT AT [facility])  I-STAT BETA HCG BLOOD, ED ([facility], [facility], [facility] ONLY)    Imaging Review Dg Abd 1 View  01/12/2016  CLINICAL DATA:  Constipation with nausea and vomiting. EXAM: ABDOMEN - 1 VIEW COMPARISON:  11/13/2015 FINDINGS: Examination demonstrates mild fecal retention over the right colon, splenic flexure and rectosigmoid colon. No evidence of obstruction. Remainder the exam is unchanged. IMPRESSION: Nonobstructive bowel gas pattern with mild fecal retention over the right colon, splenic flexure and rectosigmoid colon. Electronically Signed   By: Elberta Fortis M.D.   On: 01/12/2016 19:08   I have personally reviewed and evaluated these images and lab results as part of my medical decision-making.   EKG Interpretation None      MDM   Final diagnoses:  Constipation, unspecified constipation type    Labs: Urinalysis, i-STAT beta-hCG, lipase, CMP CBC- no significant findings  Imaging: DG abdomen 1 view- nonobstructive bowel gas pattern with mild fecal retention of the right colon  Consults:  Therapeutics:  Discharge Meds:   Assessment/Plan: This presentation is most consistent with constipation. She has a history of the same, has not tried her medications at home that generally resolved his condition. Patient reports she's not eating very much fiber, not drinking plenty of fluids. She reports she is able tolerate by mouth, has no focal abdominal tenderness, no significant findings on her laboratory  analysis her physical exam and resuscitate further evaluation or management here in the ED. Patient will be discharged home with instructions to use at home medications, follow-up with primary care symptoms continue to persist. She is given strict return precautions event new worsening signs or symptoms present. Patient verbalized understanding and agreement today's plan and had no further questions or concerns at time of discharge         Eyvonne Mechanic, PA-C 01/13/16 0028  Linwood Dibbles, MD 01/14/16 1113

## 2016-01-12 NOTE — ED Notes (Signed)
Pt reports hx of constipation, last bm was 3-4 days ago. Woke up this am and unable to go to work and had episode of n/v. Denies diarrhea.

## 2016-02-04 ENCOUNTER — Telehealth: Payer: Self-pay | Admitting: *Deleted

## 2016-02-04 NOTE — Telephone Encounter (Signed)
[facility]. Patient was waiting on a call from us- but did not get it- she is late on her Depo now (last day- yesterday). She has not been sexually active and she would like to get the device this week. She said Rachelle told her something about a pill for her cervix. Told patient I would check with her provider and let her know what the plan is. Advised no intercourse until she has birth control.

## 2016-02-05 ENCOUNTER — Other Ambulatory Visit: Payer: Self-pay | Admitting: Certified Nurse Midwife

## 2016-02-05 DIAGNOSIS — Z3009 Encounter for other general counseling and advice on contraception: Secondary | ICD-10-CM

## 2016-02-05 MED ORDER — IBUPROFEN 800 MG PO TABS: 800.0000 mg | ORAL_TABLET | Freq: Three times a day (TID) | ORAL | Status: DC | PRN

## 2016-02-05 MED ORDER — CYCLOBENZAPRINE HCL 10 MG PO TABS: 10.0000 mg | ORAL_TABLET | Freq: Three times a day (TID) | ORAL | Status: DC | PRN

## 2016-02-05 MED ORDER — MISOPROSTOL 200 MCG PO TABS: ORAL_TABLET | ORAL | Status: DC

## 2016-02-05 NOTE — Telephone Encounter (Signed)
Please schedule.  I have sent cytotec for cervical dilation, flexeril and motrin for the pain to the pharmacy. Thank you.  R.Denney CNM

## 2016-02-07 NOTE — Telephone Encounter (Signed)
SPOKE TO PATIENT- SHE WAS GIVEN INSTRUCTIONS

## 2016-02-08 ENCOUNTER — Ambulatory Visit (INDEPENDENT_AMBULATORY_CARE_PROVIDER_SITE_OTHER): Payer: Medicaid Other | Admitting: Certified Nurse Midwife

## 2016-02-08 ENCOUNTER — Encounter: Payer: Self-pay | Admitting: *Deleted

## 2016-02-08 ENCOUNTER — Encounter: Payer: Self-pay | Admitting: Certified Nurse Midwife

## 2016-02-08 VITALS — BP 116/79 | HR 103 | Temp 98.6°F | Wt 242.0 lb

## 2016-02-08 DIAGNOSIS — Z3202 Encounter for pregnancy test, result negative: Secondary | ICD-10-CM

## 2016-02-08 DIAGNOSIS — Z30430 Encounter for insertion of intrauterine contraceptive device: Secondary | ICD-10-CM | POA: Insufficient documentation

## 2016-02-08 DIAGNOSIS — Z30014 Encounter for initial prescription of intrauterine contraceptive device: Secondary | ICD-10-CM

## 2016-02-08 DIAGNOSIS — Z01812 Encounter for preprocedural laboratory examination: Secondary | ICD-10-CM

## 2016-02-08 LAB — POCT URINE PREGNANCY: PREG TEST UR: NEGATIVE

## 2016-02-08 NOTE — Progress Notes (Signed)
Patient ID: Orland Penmananielle L Barbee, female   DOB: July 04, 1996, 20 y.o.   MRN: [id number]  IUD Procedure Note   DIAGNOSIS: Desires long-term, reversible contraception   PROCEDURE: IUD placement Performing Provider: Orvilla Cornwallachelle Myishia Kasik CNM  Patient counseled prior to procedure. I explained risks and benefits of Kyleena IUD, reviewed alternative forms of contraception. Patient stated understanding and consented to continue with procedure.   LMP: unknown, on depo injections Pregnancy Test: Negative Lot #: [id number] Expiration Date:  March 2018   IUD type: [   ] Mirena   [   ] Paraguard  [   ] Skyla   [X]   Kyleena  PROCEDURE:  Timeout procedure was performed to ensure right patient and right site.  A bimanual exam was performed to determine the position of the uterus, retroverted. The speculum was placed. The vagina and cervix was sterilized in the usual manner and sterile technique was maintained throughout the course of the procedure. A single toothed tenaculum was not required. The depth of the uterus was sounded to 8 cm. The IUD was inserted to the appropriate depth and inserted without difficulty.  The string was cut to an estimated 4 cm length. Bleeding was minimal. The patient tolerated the procedure well.   Follow up: The patient tolerated the procedure well without complications.  Standard post-procedure care is explained and return precautions are given.  Orvilla Cornwallachelle Teanna Elem CNM

## 2016-02-12 ENCOUNTER — Ambulatory Visit: Payer: Medicaid Other | Admitting: Certified Nurse Midwife

## 2016-02-12 ENCOUNTER — Telehealth: Payer: Self-pay | Admitting: Certified Nurse Midwife

## 2016-02-12 NOTE — Telephone Encounter (Signed)
Pt needs to talk to someone about her IUD. Pt is having some pain in her butt area. Pt states that she still feels the string, but is wondering about the pain in her butt. Pt said she didn't have any of these pains prior to getting her IUD put in. Please advise.

## 2016-02-13 ENCOUNTER — Ambulatory Visit (INDEPENDENT_AMBULATORY_CARE_PROVIDER_SITE_OTHER): Payer: Medicaid Other | Admitting: Certified Nurse Midwife

## 2016-02-13 ENCOUNTER — Encounter: Payer: Self-pay | Admitting: Certified Nurse Midwife

## 2016-02-13 VITALS — BP 127/88 | HR 84 | Temp 98.1°F | Wt 241.0 lb

## 2016-02-13 DIAGNOSIS — Z30431 Encounter for routine checking of intrauterine contraceptive device: Secondary | ICD-10-CM

## 2016-02-13 NOTE — Progress Notes (Signed)
Patient ID: Lindsey Moon, female   DOB: 06/22/1996, 20 y.o.   MRN: [id number]  Chief Complaint  Patient presents with  . Follow-up    IUD Check    HPI Lindsey Moon is a 20 y.o. female.  Here for f/u on IUD insertion.  IUD was placed without difficulty on Friday  02/08/16.  Patient has been having rectal pain since insertion.  Has a hx of constipation.  Has not been having regular bowel movements.  Has not been taking colace, discussed fiber rich diet.  Patient verbalized understanding.    HPI  Past Medical History  Diagnosis Date  . Scoliosis   . TMJ disease   . Headache   . GERD (gastroesophageal reflux disease)   . Obesity   . Allergic rhinitis     Past Surgical History  Procedure Laterality Date  . Arm surgery Left     Family History  Problem Relation Age of Onset  . Diabetes Mother   . Hypertension Mother   . Mental illness Mother     Social History Social History  Substance Use Topics  . Smoking status: Never Smoker   . Smokeless tobacco: None  . Alcohol Use: No    No Known Allergies  Current Outpatient Prescriptions  Medication Sig Dispense Refill  . fluticasone (FLONASE) 50 MCG/ACT nasal spray Place into both nostrils as needed for allergies or rhinitis.    Marland Kitchen ibuprofen (ADVIL,MOTRIN) 800 MG tablet Take 1 tablet (800 mg total) by mouth every 8 (eight) hours as needed. 60 tablet 1  . topiramate (TOPAMAX) 50 MG tablet One po bid xone week, then 2 tabs po bid 120 tablet 11  . traMADol (ULTRAM) 50 MG tablet Take by mouth every 6 (six) hours as needed. Reported on 02/08/2016    . cyclobenzaprine (FLEXERIL) 10 MG tablet Take 1 tablet (10 mg total) by mouth 3 (three) times daily as needed for muscle spasms. (Patient not taking: Reported on 02/08/2016) 30 tablet 0  . docusate sodium (COLACE) 100 MG capsule Take 1 capsule (100 mg total) by mouth 2 (two) times daily. (Patient not taking: Reported on 02/13/2016) 90 capsule 1  . METHOCARBAMOL PO Take by mouth as  needed. Reported on 02/13/2016    . misoprostol (CYTOTEC) 200 MCG tablet Place 1 tablet vaginally 3 hours before procedure. (Patient not taking: Reported on 02/13/2016) 3 tablet 0  . omeprazole (PRILOSEC) 20 MG capsule Take 1 capsule (20 mg total) by mouth 2 (two) times daily before a meal. (Patient not taking: Reported on 02/13/2016) 60 capsule 12  . rizatriptan (MAXALT-MLT) 5 MG disintegrating tablet Take 1 tablet (5 mg total) by mouth as needed. May repeat in 2 hours if needed (Patient not taking: Reported on 02/13/2016) 15 tablet 6   No current facility-administered medications for this visit.    Review of Systems Review of Systems Constitutional: negative for fatigue and weight loss Respiratory: negative for cough and wheezing Cardiovascular: negative for chest pain, fatigue and palpitations Gastrointestinal: negative for abdominal pain and change in bowel habits, + constipation Genitourinary:negative Integument/breast: negative for nipple discharge Musculoskeletal:negative for myalgias Neurological: negative for gait problems and tremors Behavioral/Psych: negative for abusive relationship, depression Endocrine: negative for temperature intolerance     Blood pressure 127/88, pulse 84, temperature 98.1 F (36.7 C), weight 241 lb (109.317 kg).  Physical Exam Physical Exam General:   alert  Skin:   no rash or abnormalities  Lungs:   clear to auscultation bilaterally  Heart:  regular rate and rhythm, S1, S2 normal, no murmur, click, rub or gallop  Breasts:   deferred  Abdomen:  normal findings: no organomegaly, soft, non-tender and no hernia  Pelvis:  External genitalia: normal general appearance Urinary system: urethral meatus normal and bladder without fullness, nontender Vaginal: normal without tenderness, induration or masses Cervix: normal appearance, + IUD strings Adnexa: normal bimanual exam Uterus: anteverted and non-tender, normal size    50% of 15 min visit spent on  counseling and coordination of care.   Data Reviewed Previous medical hx, meds  Assessment     IUD check up, Strings present Constipation     Plan    No orders of the defined types were placed in this encounter.   No orders of the defined types were placed in this encounter.     Possible management options include:GI consult Follow up as needed.

## 2016-02-14 NOTE — Telephone Encounter (Signed)
Pt seen in office since call.

## 2016-02-15 ENCOUNTER — Ambulatory Visit: Payer: Medicaid Other | Admitting: Certified Nurse Midwife

## 2016-02-26 ENCOUNTER — Ambulatory Visit: Payer: Medicaid Other | Admitting: Nurse Practitioner

## 2016-02-27 ENCOUNTER — Encounter: Payer: Self-pay | Admitting: Nurse Practitioner

## 2016-03-07 ENCOUNTER — Ambulatory Visit: Payer: Medicaid Other | Admitting: Certified Nurse Midwife

## 2016-03-12 ENCOUNTER — Ambulatory Visit: Payer: Medicaid Other | Admitting: Certified Nurse Midwife

## 2016-03-12 ENCOUNTER — Ambulatory Visit: Payer: Medicaid Other | Admitting: Nurse Practitioner

## 2016-03-13 ENCOUNTER — Encounter: Payer: Self-pay | Admitting: Nurse Practitioner

## 2016-03-13 ENCOUNTER — Ambulatory Visit (INDEPENDENT_AMBULATORY_CARE_PROVIDER_SITE_OTHER): Payer: Medicaid Other | Admitting: Nurse Practitioner

## 2016-03-13 VITALS — BP 109/74 | HR 73 | Ht 63.0 in | Wt 238.8 lb

## 2016-03-13 DIAGNOSIS — G43709 Chronic migraine without aura, not intractable, without status migrainosus: Secondary | ICD-10-CM | POA: Diagnosis not present

## 2016-03-13 DIAGNOSIS — M545 Low back pain: Secondary | ICD-10-CM

## 2016-03-13 DIAGNOSIS — IMO0002 Reserved for concepts with insufficient information to code with codable children: Secondary | ICD-10-CM

## 2016-03-13 NOTE — Progress Notes (Signed)
[facility]  PATIENT: Lindsey Moon DOB: 1996/10/04   REASON FOR VISIT: Follow-up for migraine, low back pain HISTORY FROM: Patient    HISTORY OF PRESENT ILLNESS: HISTORYYYDanielle L Moon is a 20 years old right-handed female, seen in refer by primary care nurse practitioner Norm Salt in December 19 2015 for evaluation of upper back pain, frequent headaches.  I reviewed and summarized the referral from her primary care physician, she had a history of allergic rhinitis, obesity, acid reflux disease  She complains of low back pain since age 36, with diagnosis of mild scoliosis, over the years, she had gradually worsening low back pain, moving at different spots, also to the upper back, she denies radiating pain to bilateral lower extremity, she denies neck pain, she denies radiating pain to bilateral upper extremity, she denies upper extremity paresthesia or weakness, she denies gait difficulty no bowel and bladder incontinence, she has been taking intermittent ibuprofen, which helpful, she does not exercise regularly, does has obesity, rapid weight gain over the past few years, especially after she was started on Depo-Provera shots as contraceptives  She also complains of intermittent left TMJ joints pain, recent flare since January 2017, popping sounds when she open her mouth wide, bilateral hard object,  She has long-standing history of migraine headaches getting worse over the past few years, she has headache 2-3 times every months, bilateral frontal temporal severe pounding headache with associated light noise sensitivity, triggered by stress, hungry, noise, smell, weather changes, her headache last about few hours to couple days, ibuprofen usually take care of her headaches, occasionally preceded by a visual aura Laboratory from December 17 2015 showed normal CMP, with creatinine of 0.9 1, GFR of 91, normal CBC with hemoglobin of 13 point 4, A1c was mild  elevated 5.9, LDL was 64, cholesterol was 117. UPDATE 04/20/2017CM [name], 20 year old female returns for follow-up. She has a history of migraine headaches was initially evaluated by Dr. Kendell Bane 12/19/2015. She was placed on Topamax at that time and is currently taking 100 mg twice daily with good relief of her headaches. She has not used the Maxalt. She is not aware of any food triggers. . She works at a [employer]. She returns for reevaluation.  REVIEW OF SYSTEMS: Full 14 system review of systems performed and notable only for those listed, all others are neg:  Constitutional: neg  Cardiovascular: neg Ear/Nose/Throat: neg  Skin: neg Eyes: neg Respiratory: neg Gastroitestinal: neg  Hematology/Lymphatic: neg  Endocrine: neg Musculoskeletal:neg Allergy/Immunology: neg Neurological: neg Psychiatric: neg Sleep : neg   ALLERGIES: No Known Allergies  HOME MEDICATIONS: Outpatient Prescriptions Prior to Visit  Medication Sig Dispense Refill  . cyclobenzaprine (FLEXERIL) 10 MG tablet Take 1 tablet (10 mg total) by mouth 3 (three) times daily as needed for muscle spasms. 30 tablet 0  . docusate sodium (COLACE) 100 MG capsule Take 1 capsule (100 mg total) by mouth 2 (two) times daily. 90 capsule 1  . fluticasone (FLONASE) 50 MCG/ACT nasal spray Place into both nostrils as needed for allergies or rhinitis.    Marland Kitchen ibuprofen (ADVIL,MOTRIN) 800 MG tablet Take 1 tablet (800 mg total) by mouth every 8 (eight) hours as needed. 60 tablet 1  . omeprazole (PRILOSEC) 20 MG capsule Take 1 capsule (20 mg total) by mouth 2 (two) times daily before a meal. 60 capsule 12  . rizatriptan (MAXALT-MLT) 5 MG disintegrating tablet Take 1 tablet (5 mg total) by mouth as needed. May repeat  in 2 hours if needed 15 tablet 6  . topiramate (TOPAMAX) 50 MG tablet One po bid xone week, then 2 tabs po bid 120 tablet 11  . traMADol (ULTRAM) 50 MG tablet Take by mouth every 6 (six) hours as needed. Reported on  02/08/2016    . METHOCARBAMOL PO Take by mouth as needed. Reported on 02/13/2016    . misoprostol (CYTOTEC) 200 MCG tablet Place 1 tablet vaginally 3 hours before procedure. (Patient not taking: Reported on 03/13/2016) 3 tablet 0   No facility-administered medications prior to visit.    PAST MEDICAL HISTORY: Past Medical History  Diagnosis Date  . Scoliosis   . TMJ disease   . Headache   . GERD (gastroesophageal reflux disease)   . Obesity   . Allergic rhinitis     PAST SURGICAL HISTORY: Past Surgical History  Procedure Laterality Date  . Arm surgery Left     FAMILY HISTORY: Family History  Problem Relation Age of Onset  . Diabetes Mother   . Hypertension Mother   . Mental illness Mother     SOCIAL HISTORY: Social History   Social History  . Marital Status: Single    Spouse Name: N/A  . Number of Children: 0  . Years of Education: In college   Occupational History  . Student    Social History Main Topics  . Smoking status: Never Smoker   . Smokeless tobacco: Not on file  . Alcohol Use: No  . Drug Use: No  . Sexual Activity: Not Currently    Birth Control/ Protection: Injection   Other Topics Concern  . Not on file   Social History Narrative   Lives at home home with her mother.   Right-handed.   Drinks 2-4 sodas per day.     PHYSICAL EXAM  Filed Vitals:   03/13/16 0857  BP: 109/74  Pulse: 73  Height:  (1.6 m)  Weight: 238 lb 12.8 oz (108.319 kg)   Body mass index is 42.31 kg/(m^2).  Generalized: Well developed, obese female in no acute distress  Head: normocephalic and atraumatic,. Oropharynx benign  Neck: Supple, no carotid bruits  Cardiac: Regular rate rhythm, no murmur  Musculoskeletal: No deformity   Neurological examination   Mentation: Alert oriented to time, place, history taking. Attention span and concentration appropriate. Recent and remote memory intact.  Follows all commands speech and language fluent.   Cranial nerve  II-XII: Fundoscopic exam reveals sharp disc margins.Pupils were equal round reactive to light extraocular movements were full, visual field were full on confrontational test. Facial sensation and strength were normal. hearing was intact to finger rubbing bilaterally. Uvula tongue midline. head turning and shoulder shrug were normal and symmetric.Tongue protrusion into cheek strength was normal. Motor: normal bulk and tone, full strength in the BUE, BLE, fine finger movements normal, no pronator drift. No focal weakness Sensory: normal and symmetric to light touch, pinprick, and  Vibration, proprioception  Coordination: finger-nose-finger, heel-to-shin bilaterally, no dysmetria Reflexes: Brachioradialis 2/2, biceps 2/2, triceps 2/2, patellar 2/2, Achilles 2/2, plantar responses were flexor bilaterally. Gait and Station: Rising up from seated position without assistance, normal stance,  moderate stride, good arm swing, smooth turning, able to perform tiptoe, and heel walking without difficulty. Tandem gait is steady  DIAGNOSTIC DATA (LABS, IMAGING, TESTING) - I reviewed patient records, labs, notes, testing and imaging myself where available.  Lab Results  Component Value Date   WBC 6.2 01/12/2016   HGB 12.5 01/12/2016   HCT  39.3 01/12/2016   MCV 82.2 01/12/2016   PLT 277 01/12/2016      Component Value Date/Time   NA 141 01/12/2016 1443   K 4.0 01/12/2016 1443   CL 108 01/12/2016 1443   CO2 24 01/12/2016 1443   GLUCOSE 94 01/12/2016 1443   BUN 10 01/12/2016 1443   CREATININE 1.04* 01/12/2016 1443   CALCIUM 9.4 01/12/2016 1443   PROT 6.7 01/12/2016 1443   ALBUMIN 3.5 01/12/2016 1443   AST 17 01/12/2016 1443   ALT 18 01/12/2016 1443   ALKPHOS 90 01/12/2016 1443   BILITOT 0.5 01/12/2016 1443   GFRNONAA >60 01/12/2016 1443   GFRAA >60 01/12/2016 1443    ASSESSMENT AND PLAN  20 y.o. year old female  has a past medical history of Scoliosis; TMJ disease; Headache; GERD  (gastroesophageal reflux disease); Obesity; and Allergic rhinitis. Here to follow-up for  migraine headaches and chronic low back pain.  Continue Topamax 50mg  two tabs twice daily Maxalt acutely for headache Given list of migraine triggers and reviewed foods Encouraged to lose weight Chronic low back pain most consistent with musculoskeletal etiology, moderate exercise and nonsteroidals as needed I spent additional 10 minutes in total face to face time with the patient more than 50% of which was spent counseling and coordination of care, reviewing test results reviewing medications and discussing and reviewing the diagnosis of migraine and further treatment options. Importance of keeping a diary if headaches worsen to include the time of the headache what you're doing any other specific information that would be useful. Discussed stress relief techniques such as deep breathing muscle relaxation mental relaxation to music. Discussed importance of exercise, regular meals  and sleep. Sleep deprivation can be a migraine trigger F/U in 6 monthsVst time 25 min Nilda RiggsNancy Carolyn Iviana Blasingame, [facility] IncGNP, [facility] HospitalBC, APRN  [facility] [address], [address] [place], [place] [address] [phone]

## 2016-03-13 NOTE — Patient Instructions (Signed)
Continue Topamax 50mg  two tabs twice daily Maxalt acutely for headache Given list of migraine triggers and reviewed foods F/U in 6 months

## 2016-03-13 NOTE — Progress Notes (Signed)
I have reviewed and agreed above plan. 

## 2016-03-18 ENCOUNTER — Emergency Department (HOSPITAL_COMMUNITY)
Admission: EM | Admit: 2016-03-18 | Discharge: 2016-03-18 | Disposition: A | Discharge status: Home / Self Care | Payer: Medicaid Other | Attending: Emergency Medicine | Admitting: Emergency Medicine

## 2016-03-18 ENCOUNTER — Encounter (HOSPITAL_COMMUNITY): Payer: Self-pay | Admitting: Emergency Medicine

## 2016-03-18 DIAGNOSIS — T162XXA Foreign body in left ear, initial encounter: Secondary | ICD-10-CM

## 2016-03-18 DIAGNOSIS — Y9289 Other specified places as the place of occurrence of the external cause: Secondary | ICD-10-CM | POA: Diagnosis not present

## 2016-03-18 DIAGNOSIS — Y9389 Activity, other specified: Secondary | ICD-10-CM | POA: Insufficient documentation

## 2016-03-18 DIAGNOSIS — X58XXXA Exposure to other specified factors, initial encounter: Secondary | ICD-10-CM | POA: Diagnosis not present

## 2016-03-18 DIAGNOSIS — Z79899 Other long term (current) drug therapy: Secondary | ICD-10-CM | POA: Insufficient documentation

## 2016-03-18 DIAGNOSIS — E669 Obesity, unspecified: Secondary | ICD-10-CM | POA: Diagnosis not present

## 2016-03-18 DIAGNOSIS — Y998 Other external cause status: Secondary | ICD-10-CM | POA: Insufficient documentation

## 2016-03-18 DIAGNOSIS — S00442A External constriction of left ear, initial encounter: Secondary | ICD-10-CM | POA: Insufficient documentation

## 2016-03-18 DIAGNOSIS — M419 Scoliosis, unspecified: Secondary | ICD-10-CM | POA: Diagnosis not present

## 2016-03-18 DIAGNOSIS — K219 Gastro-esophageal reflux disease without esophagitis: Secondary | ICD-10-CM | POA: Insufficient documentation

## 2016-03-18 DIAGNOSIS — Z7951 Long term (current) use of inhaled steroids: Secondary | ICD-10-CM | POA: Diagnosis not present

## 2016-03-18 MED ORDER — CEPHALEXIN 500 MG PO CAPS: 500.0000 mg | ORAL_CAPSULE | Freq: Four times a day (QID) | ORAL | Status: DC

## 2016-03-18 MED ORDER — LIDOCAINE HCL 1 % IJ SOLN
10.0000 mL | Freq: Once | INTRAMUSCULAR | Status: AC
  Administered 2016-03-18: 10 mL via INTRADERMAL

## 2016-03-18 MED ORDER — LIDOCAINE HCL (PF) 1 % IJ SOLN
INTRAMUSCULAR | Status: AC
  Filled 2016-03-18: qty 5

## 2016-03-18 NOTE — ED Provider Notes (Signed)
CSN: [id number]     Arrival date & time 03/18/16  1502 History   First MD Initiated Contact with Patient 03/18/16 1519     Chief Complaint  Patient presents with  . Foreign Body in Ear    Ear ring stuck in ear    HPI   Lindsey Moon is a 20 y.o. female with a PMH of GERD, obesity, headaches who presents to the ED due to her earring being stuck in her left ear. She states she tried to take her earring out this morning but was unable to do so because the back of her earring became lodged in her ear. She reports mild soreness. She denies exacerbating or alleviating factors.    Past Medical History  Diagnosis Date  . Scoliosis   . TMJ disease   . Headache   . GERD (gastroesophageal reflux disease)   . Obesity   . Allergic rhinitis    Past Surgical History  Procedure Laterality Date  . Arm surgery Left    Family History  Problem Relation Age of Onset  . Diabetes Mother   . Hypertension Mother   . Mental illness Mother    Social History  Substance Use Topics  . Smoking status: Never Smoker   . Smokeless tobacco: None  . Alcohol Use: No   OB History    Gravida Para Term Preterm AB TAB SAB Ectopic Multiple Living   0 0 0 0 0 0 0 0 0 0       Review of Systems  Constitutional: Negative for fever.  HENT: Positive for ear pain.       Allergies  Review of patient's allergies indicates no known allergies.  Home Medications   Prior to Admission medications   Medication Sig Start Date End Date Taking? Authorizing Provider  cephALEXin (KEFLEX) 500 MG capsule Take 1 capsule (500 mg total) by mouth 4 (four) times daily. 03/18/16   Mady GemmaElizabeth C Westfall, PA-C  cyclobenzaprine (FLEXERIL) 10 MG tablet Take 1 tablet (10 mg total) by mouth 3 (three) times daily as needed for muscle spasms. 02/05/16   Rachelle A Denney, CNM  docusate sodium (COLACE) 100 MG capsule Take 1 capsule (100 mg total) by mouth 2 (two) times daily. 12/21/15   Rachelle A Denney, CNM  fluticasone (FLONASE)  50 MCG/ACT nasal spray Place into both nostrils as needed for allergies or rhinitis.    Historical Provider, MD  ibuprofen (ADVIL,MOTRIN) 800 MG tablet Take 1 tablet (800 mg total) by mouth every 8 (eight) hours as needed. 02/05/16   Rachelle A Denney, CNM  METHOCARBAMOL PO Take by mouth as needed. Reported on 02/13/2016    Historical Provider, MD  omeprazole (PRILOSEC) 20 MG capsule Take 1 capsule (20 mg total) by mouth 2 (two) times daily before a meal. 12/21/15   Rachelle A Denney, CNM  rizatriptan (MAXALT-MLT) 5 MG disintegrating tablet Take 1 tablet (5 mg total) by mouth as needed. May repeat in 2 hours if needed 12/19/15   Levert FeinsteinYijun Yan, MD  topiramate (TOPAMAX) 50 MG tablet One po bid xone week, then 2 tabs po bid 12/19/15   Levert FeinsteinYijun Yan, MD  traMADol (ULTRAM) 50 MG tablet Take by mouth every 6 (six) hours as needed. Reported on 02/08/2016    Historical Provider, MD    BP 131/87 mmHg  Pulse 95  Temp(Src) 98.1 F (36.7 C) (Oral)  Resp 16  SpO2 100% Physical Exam  Constitutional: She is oriented to person, place, and time. She appears well-developed  and well-nourished. No distress.  HENT:  Head: Normocephalic and atraumatic.  Right Ear: External ear normal.  Left Ear: External ear normal.  Nose: Nose normal.  Earring back stuck in left posterior ear. Mild TTP. No edema, erythema, or heat.  Eyes: Conjunctivae and EOM are normal. Right eye exhibits no discharge. Left eye exhibits no discharge. No scleral icterus.  Neck: Normal range of motion. Neck supple.  Cardiovascular: Normal rate and regular rhythm.   Pulmonary/Chest: Effort normal and breath sounds normal. No respiratory distress.  Musculoskeletal: Normal range of motion. She exhibits no edema or tenderness.  Neurological: She is alert and oriented to person, place, and time.  Skin: Skin is warm and dry. She is not diaphoretic.  Psychiatric: She has a normal mood and affect. Her behavior is normal.  Nursing note and vitals  reviewed.   ED Course  .Foreign Body Removal Date/Time: 03/18/2016 4:10 PM Performed by: Mady Gemma Authorized by: Glean Hess C Consent: Verbal consent obtained. Risks and benefits: risks, benefits and alternatives were discussed Consent given by: patient Patient understanding: patient states understanding of the procedure being performed Patient consent: the patient's understanding of the procedure matches consent given Procedure consent: procedure consent matches procedure scheduled Relevant documents: relevant documents present and verified Site marked: the operative site was marked Required items: required blood products, implants, devices, and special equipment available Patient identity confirmed: verbally with patient Time out: Immediately prior to procedure a "time out" was called to verify the correct patient, procedure, equipment, support staff and site/side marked as required. Body area: ear Location details: left ear Anesthesia: local infiltration Local anesthetic: lidocaine 1% without epinephrine Anesthetic total: 2 ml Patient sedated: no Patient restrained: no Patient cooperative: yes Localization method: visualized Removal mechanism: forceps Complexity: simple 1 objects recovered. Objects recovered: earring back Post-procedure assessment: foreign body removed Patient tolerance: Patient tolerated the procedure well with no immediate complications    Labs Review Labs Reviewed - No data to display  Imaging Review No results found.    EKG Interpretation None      MDM   Final diagnoses:  Foreign body in left ear, initial encounter    20 year old female presents with earring back stuck in her left ear. States she noticed this today prior to arrival. Patient is afebrile. Vital signs stable. On exam, she has an earring back stuck in her left ear with associated TTP. FB removed, which the patient tolerated well. Will discharge with  keflex. Patient to follow-up with PCP. Return precautions discussed. Patient verbalizes her understanding and is in agreement with plan. . BP 131/87 mmHg  Pulse 95  Temp(Src) 98.1 F (36.7 C) (Oral)  Resp 16  SpO2 100%     Mady Gemma, PA-C 03/18/16 1613  Pricilla Loveless, MD 03/20/16 [id number]

## 2016-03-18 NOTE — Discharge Instructions (Signed)
1. Medications: keflex, tylenol or motrin for pain, usual home medications 2. Treatment: rest, drink plenty of fluids 3. Follow Up: please followup with your primary doctor for discussion of your diagnoses and further evaluation after today's visit; if you do not have a primary care doctor use the phone number listed in your discharge paperwork to find one; please return to the ER for increased pain, redness, swelling, new or worsening symptoms   Ear Foreign Body An ear foreign body is an object that is stuck in your ear. Objects in your ear can cause:  Pain.  Buzzing or roaring sounds.  Hearing loss.  Fluid coming from your ear (drainage) or bleeding.  Feeling sick to your stomach (nausea) or throwing up (vomiting).  A feeling that your ear is full. HOME CARE  Keep all follow-up visits as told by your doctor. This is important.  Take medicines only as told by your doctor.  If you were prescribed an antibiotic medicine, finish it all even if you start to feel better. GET HELP IF:  You have a headache.  Your have blood coming from your ear.  You have a fever.  You have increased pain or swelling of your ear.  Your hearing is reduced.  You have discharge coming from your ear.   This information is not intended to replace advice given to you by your health care provider. Make sure you discuss any questions you have with your health care provider.   Document Released: 04/30/2010 Document Revised: 12/01/2014 Document Reviewed: 06/26/2014 Elsevier Interactive Patient Education [employer]2016 Elsevier Inc.

## 2016-03-18 NOTE — ED Notes (Addendum)
Pt states her ear ring is stuck in her ear lobe and she cannot get it out

## 2016-04-01 ENCOUNTER — Ambulatory Visit (INDEPENDENT_AMBULATORY_CARE_PROVIDER_SITE_OTHER): Payer: Medicaid Other | Admitting: Certified Nurse Midwife

## 2016-04-01 ENCOUNTER — Encounter: Payer: Self-pay | Admitting: Certified Nurse Midwife

## 2016-04-01 VITALS — BP 141/84 | HR 101 | Wt 244.0 lb

## 2016-04-01 DIAGNOSIS — Z30431 Encounter for routine checking of intrauterine contraceptive device: Secondary | ICD-10-CM

## 2016-04-01 NOTE — Progress Notes (Signed)
Patient ID: Lindsey Moon, female   DOB: 06-25-1996, 20 y.o.   MRN: [id number]   Chief Complaint  Patient presents with  . Contraception    String Check    HPI Lindsey Moon is a 20 y.o. female.  Patient is here for IUD check up.  Has not been having any vaginal bleeding since insertion.  Has had chronic constipation.   HPI  Past Medical History  Diagnosis Date  . Scoliosis   . TMJ disease   . Headache   . GERD (gastroesophageal reflux disease)   . Obesity   . Allergic rhinitis     Past Surgical History  Procedure Laterality Date  . Arm surgery Left     Family History  Problem Relation Age of Onset  . Diabetes Mother   . Hypertension Mother   . Mental illness Mother     Social History Social History  Substance Use Topics  . Smoking status: Never Smoker   . Smokeless tobacco: Not on file  . Alcohol Use: No    No Known Allergies  Current Outpatient Prescriptions  Medication Sig Dispense Refill  . cephALEXin (KEFLEX) 500 MG capsule Take 1 capsule (500 mg total) by mouth 4 (four) times daily. 40 capsule 0  . cyclobenzaprine (FLEXERIL) 10 MG tablet Take 1 tablet (10 mg total) by mouth 3 (three) times daily as needed for muscle spasms. 30 tablet 0  . docusate sodium (COLACE) 100 MG capsule Take 1 capsule (100 mg total) by mouth 2 (two) times daily. 90 capsule 1  . fluticasone (FLONASE) 50 MCG/ACT nasal spray Place into both nostrils as needed for allergies or rhinitis.    Marland Kitchen ibuprofen (ADVIL,MOTRIN) 800 MG tablet Take 1 tablet (800 mg total) by mouth every 8 (eight) hours as needed. 60 tablet 1  . METHOCARBAMOL PO Take by mouth as needed. Reported on 02/13/2016    . omeprazole (PRILOSEC) 20 MG capsule Take 1 capsule (20 mg total) by mouth 2 (two) times daily before a meal. 60 capsule 12  . rizatriptan (MAXALT-MLT) 5 MG disintegrating tablet Take 1 tablet (5 mg total) by mouth as needed. May repeat in 2 hours if needed 15 tablet 6  . topiramate (TOPAMAX) 50 MG  tablet One po bid xone week, then 2 tabs po bid 120 tablet 11  . traMADol (ULTRAM) 50 MG tablet Take by mouth every 6 (six) hours as needed. Reported on 02/08/2016     No current facility-administered medications for this visit.    Review of Systems Review of Systems Constitutional: negative for fatigue and weight loss Respiratory: negative for cough and wheezing Cardiovascular: negative for chest pain, fatigue and palpitations Gastrointestinal: negative for abdominal pain and change in bowel habits Genitourinary:negative Integument/breast: negative for nipple discharge Musculoskeletal:negative for myalgias Neurological: negative for gait problems and tremors Behavioral/Psych: negative for abusive relationship, depression Endocrine: negative for temperature intolerance     Blood pressure 141/84, pulse 101, weight 244 lb (110.678 kg).  Physical Exam Physical Exam General:   alert  Skin:   no rash or abnormalities  Lungs:   clear to auscultation bilaterally  Heart:   regular rate and rhythm, S1, S2 normal, no murmur, click, rub or gallop  Breasts:   deferred  Abdomen:  normal findings: no organomegaly, soft, non-tender and no hernia  obese  Pelvis:  External genitalia: normal general appearance Urinary system: urethral meatus normal and bladder without fullness, nontender Vaginal: normal without tenderness, induration or masses Cervix: normal appearance, + IUD  strings Adnexa: normal bimanual exam Uterus: anteverted and non-tender, normal size    50% of 15 min visit spent on counseling and coordination of care.   Data Reviewed Previous medical hx, meds  Assessment     IUD check up: strings present on exam     Plan    No orders of the defined types were placed in this encounter.   No orders of the defined types were placed in this encounter.    Possible management options include: GI consult Follow up as needed or in 1 year for annual exam.

## 2016-05-31 ENCOUNTER — Encounter (HOSPITAL_COMMUNITY): Payer: Self-pay | Admitting: *Deleted

## 2016-05-31 ENCOUNTER — Emergency Department (HOSPITAL_COMMUNITY)
Admission: EM | Admit: 2016-05-31 | Discharge: 2016-05-31 | Disposition: A | Discharge status: Home / Self Care | Payer: Medicaid Other | Attending: Emergency Medicine | Admitting: Emergency Medicine

## 2016-05-31 DIAGNOSIS — B9789 Other viral agents as the cause of diseases classified elsewhere: Secondary | ICD-10-CM | POA: Diagnosis not present

## 2016-05-31 DIAGNOSIS — J028 Acute pharyngitis due to other specified organisms: Secondary | ICD-10-CM | POA: Diagnosis not present

## 2016-05-31 DIAGNOSIS — E119 Type 2 diabetes mellitus without complications: Secondary | ICD-10-CM | POA: Diagnosis not present

## 2016-05-31 DIAGNOSIS — J029 Acute pharyngitis, unspecified: Secondary | ICD-10-CM

## 2016-05-31 DIAGNOSIS — J069 Acute upper respiratory infection, unspecified: Secondary | ICD-10-CM

## 2016-05-31 HISTORY — DX: Type 2 diabetes mellitus without complications: E11.9

## 2016-05-31 LAB — RAPID STREP SCREEN (MED CTR MEBANE ONLY): STREPTOCOCCUS, GROUP A SCREEN (DIRECT): NEGATIVE

## 2016-05-31 LAB — MONONUCLEOSIS SCREEN: MONO SCREEN: NEGATIVE

## 2016-05-31 MED ORDER — BENZONATATE 100 MG PO CAPS: 100.0000 mg | ORAL_CAPSULE | Freq: Three times a day (TID) | ORAL | Status: DC

## 2016-05-31 MED ORDER — FLUTICASONE PROPIONATE 50 MCG/ACT NA SUSP: 2.0000 | Freq: Every day | NASAL | Status: DC

## 2016-05-31 MED ORDER — IBUPROFEN 800 MG PO TABS
800.0000 mg | ORAL_TABLET | Freq: Once | ORAL | Status: AC
  Administered 2016-05-31: 800 mg via ORAL
  Filled 2016-05-31: qty 1

## 2016-05-31 NOTE — Discharge Instructions (Signed)
1. Medications: flonase, mucinex, tessalon, usual home medications 2. Treatment: rest, drink plenty of fluids, take tylenol or ibuprofen for fever control 3. Follow Up: Please followup with your primary doctor in 3 days for discussion of your diagnoses and further evaluation after today's visit; if you do not have a primary care doctor use the resource guide provided to find one; Return to the ER for high fevers, difficulty breathing or other concerning symptoms    Pharyngitis Pharyngitis is redness, pain, and swelling (inflammation) of your pharynx.  CAUSES  Pharyngitis is usually caused by infection. Most of the time, these infections are from viruses (viral) and are part of a cold. However, sometimes pharyngitis is caused by bacteria (bacterial). Pharyngitis can also be caused by allergies. Viral pharyngitis may be spread from person to person by coughing, sneezing, and personal items or utensils (cups, forks, spoons, toothbrushes). Bacterial pharyngitis may be spread from person to person by more intimate contact, such as kissing.  SIGNS AND SYMPTOMS  Symptoms of pharyngitis include:   Sore throat.   Tiredness (fatigue).   Low-grade fever.   Headache.  Joint pain and muscle aches.  Skin rashes.  Swollen lymph nodes.  Plaque-like film on throat or tonsils (often seen with bacterial pharyngitis). DIAGNOSIS  Your health care provider will ask you questions about your illness and your symptoms. Your medical history, along with a physical exam, is often all that is needed to diagnose pharyngitis. Sometimes, a rapid strep test is done. Other lab tests may also be done, depending on the suspected cause.  TREATMENT  Viral pharyngitis will usually get better in 3-4 days without the use of medicine. Bacterial pharyngitis is treated with medicines that kill germs (antibiotics).  HOME CARE INSTRUCTIONS   Drink enough water and fluids to keep your urine clear or pale yellow.   Only  take over-the-counter or prescription medicines as directed by your health care provider:   If you are prescribed antibiotics, make sure you finish them even if you start to feel better.   Do not take aspirin.   Get lots of rest.   Gargle with 8 oz of salt water ( tsp of salt per 1 qt of water) as often as every 1-2 hours to soothe your throat.   Throat lozenges (if you are not at risk for choking) or sprays may be used to soothe your throat. SEEK MEDICAL CARE IF:   You have large, tender lumps in your neck.  You have a rash.  You cough up green, yellow-brown, or bloody spit. SEEK IMMEDIATE MEDICAL CARE IF:   Your neck becomes stiff.  You drool or are unable to swallow liquids.  You vomit or are unable to keep medicines or liquids down.  You have severe pain that does not go away with the use of recommended medicines.  You have trouble breathing (not caused by a stuffy nose). MAKE SURE YOU:   Understand these instructions.  Will watch your condition.  Will get help right away if you are not doing well or get worse.   This information is not intended to replace advice given to you by your health care provider. Make sure you discuss any questions you have with your health care provider.   Document Released: 11/10/2005 Document Revised: 08/31/2013 Document Reviewed: 07/18/2013 Elsevier Interactive Patient Education [employer]2016 Elsevier Inc.

## 2016-05-31 NOTE — ED Notes (Signed)
Pt is in stable condition upon d/c and ambulates from ED. 

## 2016-05-31 NOTE — ED Provider Notes (Signed)
CSN: [id number]     Arrival date & time 05/31/16  1456 History   First MD Initiated Contact with Patient 05/31/16 1657     Chief Complaint  Patient presents with  . Sore Throat     (Consider location/radiation/quality/duration/timing/severity/associated sxs/prior Treatment) The history is provided by the patient and medical records. No language interpreter was used.     Lindsey Moon is a 20 y.o. female  with a hx of diet controlled diabetes presents to the Emergency Department complaining of gradual, persistent, progressively worsening sore throat with associated rhinorrhea, post nasal drip, mild cough, mild generalized headache, decreased appetite and myalgias onset last night.  She denies fevers, neck pain, neck stiffness.  Nursing note states nausea however patient reports this is intermittent and not currently present. She reports that her current headache is similar to previous and is not associated with visual changes.  Patient denies sick contacts. No treatments prior to arrival. No bleeding factors. Eating makes her throat hurt worse.  She denies abdominal pain,  vomiting, diarrhea, weakness, dizziness, syncope, dysuria, hematuria, chest pain, shortness of breath.  Past Medical History  Diagnosis Date  . Scoliosis   . TMJ disease   . Headache   . GERD (gastroesophageal reflux disease)   . Obesity   . Allergic rhinitis   . Diabetes mellitus without complication [facility])    Past Surgical History  Procedure Laterality Date  . Arm surgery Left    Family History  Problem Relation Age of Onset  . Diabetes Mother   . Hypertension Mother   . Mental illness Mother    Social History  Substance Use Topics  . Smoking status: Never Smoker   . Smokeless tobacco: None  . Alcohol Use: No   OB History    Gravida Para Term Preterm AB TAB SAB Ectopic Multiple Living   0 0 0 0 0 0 0 0 0 0      Review of Systems  Constitutional: Positive for fatigue. Negative for fever, chills and  appetite change.  HENT: Positive for congestion, postnasal drip, rhinorrhea, sinus pressure and sore throat. Negative for ear discharge, ear pain and mouth sores.   Eyes: Negative for visual disturbance.  Respiratory: Positive for cough. Negative for chest tightness, shortness of breath, wheezing and stridor.   Cardiovascular: Negative for chest pain, palpitations and leg swelling.  Gastrointestinal: Negative for nausea, vomiting, abdominal pain and diarrhea.  Genitourinary: Negative for dysuria, urgency, frequency and hematuria.  Musculoskeletal: Negative for myalgias, back pain, arthralgias and neck stiffness.  Skin: Negative for rash.  Neurological: Positive for headaches. Negative for syncope, light-headedness and numbness.  Hematological: Negative for adenopathy.  Psychiatric/Behavioral: The patient is not nervous/anxious.   All other systems reviewed and are negative.     Allergies  Review of patient's allergies indicates no known allergies.  Home Medications   Prior to Admission medications   Medication Sig Start Date End Date Taking? Authorizing Provider  benzonatate (TESSALON) 100 MG capsule Take 1 capsule (100 mg total) by mouth every 8 (eight) hours. 05/31/16   Gaston Dase, PA-C  cephALEXin (KEFLEX) 500 MG capsule Take 1 capsule (500 mg total) by mouth 4 (four) times daily. 03/18/16   Mady Gemma, PA-C  cyclobenzaprine (FLEXERIL) 10 MG tablet Take 1 tablet (10 mg total) by mouth 3 (three) times daily as needed for muscle spasms. 02/05/16   Rachelle A Denney, CNM  docusate sodium (COLACE) 100 MG capsule Take 1 capsule (100 mg total) by mouth 2 (two)  times daily. 12/21/15   Rachelle A Denney, CNM  fluticasone (FLONASE) 50 MCG/ACT nasal spray Place 2 sprays into both nostrils daily. 05/31/16   Taniqua Issa, PA-C  ibuprofen (ADVIL,MOTRIN) 800 MG tablet Take 1 tablet (800 mg total) by mouth every 8 (eight) hours as needed. 02/05/16   Rachelle A Denney, CNM   METHOCARBAMOL PO Take by mouth as needed. Reported on 02/13/2016    Historical Provider, MD  omeprazole (PRILOSEC) 20 MG capsule Take 1 capsule (20 mg total) by mouth 2 (two) times daily before a meal. 12/21/15   Rachelle A Denney, CNM  rizatriptan (MAXALT-MLT) 5 MG disintegrating tablet Take 1 tablet (5 mg total) by mouth as needed. May repeat in 2 hours if needed 12/19/15   Levert Feinstein, MD  topiramate (TOPAMAX) 50 MG tablet One po bid xone week, then 2 tabs po bid 12/19/15   Levert Feinstein, MD  traMADol (ULTRAM) 50 MG tablet Take by mouth every 6 (six) hours as needed. Reported on 02/08/2016    Historical Provider, MD   BP 129/90 mmHg  Pulse 109  Temp(Src) 98.9 F (37.2 C) (Oral)  Resp 18  Ht 5' 3.5" (1.613 m)  Wt 112.634 kg  BMI 43.29 kg/m2  SpO2 99% Physical Exam  Constitutional: She appears well-developed and well-nourished. No distress.  HENT:  Head: Normocephalic and atraumatic.  Right Ear: Tympanic membrane, external ear and ear canal normal.  Left Ear: Tympanic membrane, external ear and ear canal normal.  Nose: Mucosal edema and rhinorrhea present. No epistaxis. Right sinus exhibits no maxillary sinus tenderness and no frontal sinus tenderness. Left sinus exhibits no maxillary sinus tenderness and no frontal sinus tenderness.  Mouth/Throat: Uvula is midline and mucous membranes are normal. Mucous membranes are not pale, not dry and not cyanotic. No trismus in the jaw. No uvula swelling. Posterior oropharyngeal erythema ( Minimal) present. No oropharyngeal exudate, posterior oropharyngeal edema or tonsillar abscesses.  Eyes: Conjunctivae are normal. Pupils are equal, round, and reactive to light.  Neck: Normal range of motion, full passive range of motion without pain and phonation normal. No tracheal tenderness, no spinous process tenderness and no muscular tenderness present. No rigidity. No erythema and normal range of motion present. No Brudzinski's sign and no Kernig's sign noted.  Range  of motion without pain  No midline or paraspinal tenderness Normal phonation No stridor Handling secretions without difficulty No nuchal rigidity or meningeal signs  Cardiovascular: Normal rate, regular rhythm, normal heart sounds and intact distal pulses.   Pulses:      Radial pulses are 2+ on the right side, and 2+ on the left side.  Pulmonary/Chest: Effort normal and breath sounds normal. No stridor. No respiratory distress. She has no decreased breath sounds. She has no wheezes.  Equal chest expansion, clear and equal breath sounds without focal wheezes, rhonchi or rales  Abdominal: Soft. There is no tenderness.  Musculoskeletal: Normal range of motion.  Lymphadenopathy:       Head (right side): Submandibular and tonsillar adenopathy present. No submental, no preauricular, no posterior auricular and no occipital adenopathy present.       Head (left side): Submandibular and tonsillar adenopathy present. No submental, no preauricular, no posterior auricular and no occipital adenopathy present.    She has cervical adenopathy (Anterior, tender, minimal).       Right cervical: No superficial cervical, no deep cervical and no posterior cervical adenopathy present.      Left cervical: No superficial cervical, no deep cervical and no  posterior cervical adenopathy present.  Neurological: She is alert.  Alert and oriented Moves all extremities without ataxia  Skin: Skin is warm and dry. No rash noted. She is not diaphoretic.  Psychiatric: She has a normal mood and affect.  Nursing note and vitals reviewed.   ED Course  Procedures (including critical care time) Labs Review Labs Reviewed  RAPID STREP SCREEN (NOT AT [facility])  CULTURE, GROUP A STREP [facility]  MONONUCLEOSIS SCREEN    MDM   Final diagnoses:  Viral URI with cough  Viral pharyngitis   Jarita L Trachtenberg presents with URI ssx and viral pharyngitis.  Pt afebrile without tonsillar exudate, negative strep. Presents with mild  cervical lymphadenopathy, & dysphagia; diagnosis of viral pharyngitis. No abx indicated. DC w symptomatic tx for pain  Pt does not appear dehydrated, but did discuss importance of water rehydration. Triage vitals show tachycardia however clinical exam is without the same. Presentation non concerning for PTA or infxn spread to soft tissue. No trismus or uvula deviation. Specific return precautions discussed. Pt able to drink water in ED without difficulty with intact air way. Recommended PCP follow up.    Dahlia ClientHannah Marquice Uddin, PA-C 05/31/16 1742  Mancel BaleElliott Wentz, MD 06/01/16 [phone]

## 2016-05-31 NOTE — ED Notes (Addendum)
Sore throat, decreased appetite and body aches since yesterday. Denies fevers. No meds prior to arrival. Pt also states she is having onset of a migraine headache and nausea.

## 2016-06-01 LAB — CULTURE, GROUP A STREP (THRC)

## 2016-07-11 ENCOUNTER — Ambulatory Visit: Payer: Self-pay | Admitting: Certified Nurse Midwife

## 2016-08-24 ENCOUNTER — Encounter (HOSPITAL_COMMUNITY): Payer: Self-pay | Admitting: *Deleted

## 2016-09-11 ENCOUNTER — Encounter: Payer: Self-pay | Admitting: Nurse Practitioner

## 2016-09-11 ENCOUNTER — Ambulatory Visit (INDEPENDENT_AMBULATORY_CARE_PROVIDER_SITE_OTHER): Payer: Medicaid Other | Admitting: Nurse Practitioner

## 2016-09-11 VITALS — BP 113/72 | HR 87 | Ht 63.5 in | Wt 253.0 lb

## 2016-09-11 DIAGNOSIS — R519 Headache, unspecified: Secondary | ICD-10-CM

## 2016-09-11 DIAGNOSIS — R51 Headache: Secondary | ICD-10-CM

## 2016-09-11 DIAGNOSIS — M545 Low back pain: Secondary | ICD-10-CM | POA: Diagnosis not present

## 2016-09-11 DIAGNOSIS — G43709 Chronic migraine without aura, not intractable, without status migrainosus: Secondary | ICD-10-CM | POA: Diagnosis not present

## 2016-09-11 DIAGNOSIS — G8929 Other chronic pain: Secondary | ICD-10-CM

## 2016-09-11 DIAGNOSIS — IMO0002 Reserved for concepts with insufficient information to code with codable children: Secondary | ICD-10-CM

## 2016-09-11 DIAGNOSIS — R400 Somnolence: Secondary | ICD-10-CM

## 2016-09-11 NOTE — Progress Notes (Signed)
[facility]  PATIENT: Lindsey Moon DOB: 1995/12/25   REASON FOR VISIT: Follow-up for migraine, morning headaches and daytime somnolence  HISTORY FROM: Patient    HISTORY OF PRESENT ILLNESS: HISTORYYYDanielle L Moon is a 20 years old right-handed female, seen in refer by primary care nurse practitioner Norm Salt in December 19 2015 for evaluation of upper back pain, frequent headaches.  I reviewed and summarized the referral from her primary care physician, she had a history of allergic rhinitis, obesity, acid reflux disease  She complains of low back pain since age 66, with diagnosis of mild scoliosis, over the years, she had gradually worsening low back pain, moving at different spots, also to the upper back, she denies radiating pain to bilateral lower extremity, she denies neck pain, she denies radiating pain to bilateral upper extremity, she denies upper extremity paresthesia or weakness, she denies gait difficulty no bowel and bladder incontinence, she has been taking intermittent ibuprofen, which helpful, she does not exercise regularly, does has obesity, rapid weight gain over the past few years, especially after she was started on Depo-Provera shots as contraceptives  She also complains of intermittent left TMJ joints pain, recent flare since January 2017, popping sounds when she open her mouth wide, bilateral hard object,  She has long-standing history of migraine headaches getting worse over the past few years, she has headache 2-3 times every months, bilateral frontal temporal severe pounding headache with associated light noise sensitivity, triggered by stress, hungry, noise, smell, weather changes, her headache last about few hours to couple days, ibuprofen usually take care of her headaches, occasionally preceded by a visual aura Laboratory from December 17 2015 showed normal CMP, with creatinine of 0.9 1, GFR of 91, normal CBC with hemoglobin  of 13 point 4, A1c was mild elevated 5.9, LDL was 64, cholesterol was 117. UPDATE 04/20/2017CM [name], 20 year old female returns for follow-up. She has a history of migraine headaches was initially evaluated by Dr. Terrace Arabia 12/19/2015. She was placed on Topamax at that time and is currently taking 100 mg twice daily with good relief of her headaches. She has not used the Maxalt. She is not aware of any food triggers. . She works at a [employer]. She returns for reevaluation. UPDATE 10/19/2017CM [name], 20 year old female returns for follow-up. She has a history of migraine headaches and is currently on Topamax with fairly good relief. Now she reports waking up with headaches in the morning having daytime drowsiness. In addition she snores. She denies any migraine triggers for her headaches. She has never had a sleep study. She returns for reevaluation   REVIEW OF SYSTEMS: Full 14 system review of systems performed and notable only for those listed, all others are neg:  Constitutional: neg  Cardiovascular: neg Ear/Nose/Throat: neg  Skin: neg Eyes: Blurred vision Respiratory: neg Gastroitestinal: neg  Hematology/Lymphatic: neg  Endocrine: neg Musculoskeletal:neg Allergy/Immunology: neg Neurological: Headaches especially morning headaches Psychiatric: neg Sleep : Snoring, daytime sleepiness   ALLERGIES: No Known Allergies  HOME MEDICATIONS: Outpatient Medications Prior to Visit  Medication Sig Dispense Refill  . benzonatate (TESSALON) 100 MG capsule Take 1 capsule (100 mg total) by mouth every 8 (eight) hours. 21 capsule 0  . docusate sodium (COLACE) 100 MG capsule Take 1 capsule (100 mg total) by mouth 2 (two) times daily. 90 capsule 1  . fluticasone (FLONASE) 50 MCG/ACT nasal spray Place 2 sprays into both nostrils daily. 9.9 g 2  . ibuprofen (ADVIL,MOTRIN) 800  MG tablet Take 800 mg by mouth every 6 (six) hours as needed. Pain.    Marland Kitchen. omeprazole (PRILOSEC) 20 MG capsule  Take 1 capsule (20 mg total) by mouth 2 (two) times daily before a meal. 60 capsule 12  . rizatriptan (MAXALT-MLT) 5 MG disintegrating tablet Take 1 tablet (5 mg total) by mouth as needed. May repeat in 2 hours if needed 15 tablet 6  . topiramate (TOPAMAX) 50 MG tablet One po bid xone week, then 2 tabs po bid 120 tablet 11  . cetirizine (ZYRTEC) 10 MG tablet Take 10 mg by mouth daily.    . cyclobenzaprine (FLEXERIL) 10 MG tablet Take 1 tablet (10 mg total) by mouth 3 (three) times daily as needed for muscle spasms. (Patient not taking: Reported on 09/11/2016) 30 tablet 0  . METHOCARBAMOL PO Take by mouth as needed. Reported on 02/13/2016    . traMADol (ULTRAM) 50 MG tablet Take by mouth every 6 (six) hours as needed. Reported on 02/08/2016    . cephALEXin (KEFLEX) 500 MG capsule Take 1 capsule (500 mg total) by mouth 4 (four) times daily. 40 capsule 0  . ibuprofen (ADVIL,MOTRIN) 800 MG tablet Take 1 tablet (800 mg total) by mouth every 8 (eight) hours as needed. 60 tablet 1  . lidocaine (XYLOCAINE) 2 % solution Take 20 mLs by mouth as needed for pain (DO NOT SWALLOW). 100 mL 0  . ondansetron (ZOFRAN) 4 MG tablet Take 1 tablet (4 mg total) by mouth every 6 (six) hours. 12 tablet 0  . ondansetron (ZOFRAN) 4 MG tablet Take 1 tablet (4 mg total) by mouth every 6 (six) hours. 12 tablet 0  . OVER THE COUNTER MEDICATION Take 1 tablet by mouth as needed. Cold symptoms.     No facility-administered medications prior to visit.     PAST MEDICAL HISTORY: Past Medical History:  Diagnosis Date  . Allergic rhinitis   . Diabetes mellitus without complication (HCC)   . GERD (gastroesophageal reflux disease)   . Headache   . Obesity   . Scoliosis   . TMJ disease     PAST SURGICAL HISTORY: Past Surgical History:  Procedure Laterality Date  . arm surgery Left     FAMILY HISTORY: Family History  Problem Relation Age of Onset  . Diabetes Mother   . Hypertension Mother   . Mental illness Mother      SOCIAL HISTORY: Social History   Social History  . Marital status: Single    Spouse name: N/A  . Number of children: 0  . Years of education: In college   Occupational History  . Student    Social History Main Topics  . Smoking status: Never Smoker  . Smokeless tobacco: Never Used  . Alcohol use No  . Drug use: No  . Sexual activity: Not Currently    Birth control/ protection: Injection   Other Topics Concern  . Not on file   Social History Narrative   ** Merged History Encounter **       Lives at home home with her mother. Right-handed. Drinks 2-4 sodas per day.     PHYSICAL EXAM  Vitals:   09/11/16 1505  BP: 113/72  Pulse: 87  Weight: 253 lb (114.8 kg)  Height: 5' 3.5" (1.613 m)   Body mass index is 44.11 kg/m.  Generalized: Well developed, obese female in no acute distress  Head: normocephalic and atraumatic,. Oropharynx benign  Neck: Supple, no carotid bruits  Cardiac: Regular rate rhythm,  no murmur  Musculoskeletal: No deformity   Neurological examination   Mentation: Alert oriented to time, place, history taking. Attention span and concentration appropriate. Recent and remote memory intact.  Follows all commands speech and language fluent. ESS 21. FSS 40.   Cranial nerve II-XII: Fundoscopic exam reveals sharp disc margins.Pupils were equal round reactive to light extraocular movements were full, visual field were full on confrontational test. Facial sensation and strength were normal. hearing was intact to finger rubbing bilaterally. Uvula tongue midline. head turning and shoulder shrug were normal and symmetric.Tongue protrusion into cheek strength was normal. Motor: normal bulk and tone, full strength in the BUE, BLE, fine finger movements normal, no pronator drift. No focal weakness Sensory: normal and symmetric to light touch, pinprick, and  Vibration, in the upper and lower extremities Coordination: finger-nose-finger, heel-to-shin  bilaterally, no dysmetria Reflexes: Symmetric upper and lower, plantar responses were flexor bilaterally. Gait and Station: Rising up from seated position without assistance, normal stance,  moderate stride, good arm swing, smooth turning, able to perform tiptoe, and heel walking without difficulty. Tandem gait is mildly unsteady  DIAGNOSTIC DATA (LABS, IMAGING, TESTING) - I reviewed patient records, labs, notes, testing and imaging myself where available.  Lab Results  Component Value Date   WBC 6.2 01/12/2016   HGB 12.5 01/12/2016   HCT 39.3 01/12/2016   MCV 82.2 01/12/2016   PLT 277 01/12/2016      Component Value Date/Time   NA 141 01/12/2016 1443   K 4.0 01/12/2016 1443   CL 108 01/12/2016 1443   CO2 24 01/12/2016 1443   GLUCOSE 94 01/12/2016 1443   BUN 10 01/12/2016 1443   CREATININE 1.04 (H) 01/12/2016 1443   CALCIUM 9.4 01/12/2016 1443   PROT 6.7 01/12/2016 1443   ALBUMIN 3.5 01/12/2016 1443   AST 17 01/12/2016 1443   ALT 18 01/12/2016 1443   ALKPHOS 90 01/12/2016 1443   BILITOT 0.5 01/12/2016 1443   GFRNONAA >60 01/12/2016 1443   GFRAA >60 01/12/2016 1443    ASSESSMENT AND PLAN  20 y.o. year old female  has a past medical history of Scoliosis; TMJ disease; Headache;  Obesity;here to follow-up for  migraine headaches. She has a new complaint of snoring and morning headache with daytime drowsiness.The patient is a current patient of Dr. Terrace Arabia  who is out of the office today . This note is sent to the work in doctor.     Continue Topamax 50mg  two tabs twice daily Will get sleep study for her complaints of morning headaches daytime somnolence and ESS 21 Encouraged to lose weight Chronic low back pain most consistent with musculoskeletal etiology, moderate exercise and nonsteroidals as needed F/U in 6 months Nilda Riggs, [facility], [facility], APRN  [facility] [address], [address] [place], [place] [address] [phone]

## 2016-09-11 NOTE — Progress Notes (Signed)
I reviewed above note and agree with the assessment and plan.  Marvel PlanJindong Alexius Ellington, MD PhD Stroke Neurology 09/11/2016 6:36 PM

## 2016-09-11 NOTE — Patient Instructions (Signed)
Continue Topamax 50mg  two tabs twice daily Will get sleep evaluation Follow up with me in 6 months

## 2016-09-29 ENCOUNTER — Encounter: Payer: Self-pay | Admitting: Neurology

## 2016-09-29 ENCOUNTER — Ambulatory Visit (INDEPENDENT_AMBULATORY_CARE_PROVIDER_SITE_OTHER): Payer: Medicaid Other | Admitting: Neurology

## 2016-09-29 VITALS — BP 120/70 | HR 80 | Resp 20 | Ht 63.0 in | Wt 252.0 lb

## 2016-09-29 DIAGNOSIS — G4710 Hypersomnia, unspecified: Secondary | ICD-10-CM

## 2016-09-29 DIAGNOSIS — R51 Headache: Secondary | ICD-10-CM | POA: Diagnosis not present

## 2016-09-29 DIAGNOSIS — G4730 Sleep apnea, unspecified: Secondary | ICD-10-CM

## 2016-09-29 DIAGNOSIS — G4733 Obstructive sleep apnea (adult) (pediatric): Secondary | ICD-10-CM

## 2016-09-29 DIAGNOSIS — R519 Headache, unspecified: Secondary | ICD-10-CM

## 2016-09-29 DIAGNOSIS — G43101 Migraine with aura, not intractable, with status migrainosus: Secondary | ICD-10-CM | POA: Diagnosis not present

## 2016-09-29 NOTE — Patient Instructions (Signed)
Obesity Obesity is having too much body fat and a body mass index (BMI) of 30 or more. BMI is a number that is based on your height and weight. BMI is usually figured out by your doctor during regular wellness visits. The number is an estimate of how much body fat you have. Obesity can happen if you eat more calories than you can burn with exercise or other activity. It can cause major health problems or emergencies.  HOME CARE  Exercise and be active as told by your doctor. Try:  Using stairs when you can.  Parking farther away from store doors.  Gardening, biking, or walking.  Eat healthy foods and drinks that are low in calories. Eat more fruits and vegetables.  Limit fast food, sweets, and snack foods that are made with ingredients that are not natural (processed food).  Eat smaller amounts of food.  Keep a journal and write down what you eat every day. Websites can help with this.  Avoid drinking alcohol. Drink more water and drinks that have no calories.  Take vitamins and dietary pills (supplements) only as told by your doctor.  Try going to weight-loss support groups or classes. These can help to lessen stress. Dietitians and counselors may also help. GET HELP RIGHT AWAY IF:  You have pain or tightness in your chest.  You have trouble breathing or feel short of breath.  You feel weak or have loss of feeling (numbness) in your legs.  You feel confused or have trouble talking.  You have sudden changes in your vision.   This information is not intended to replace advice given to you by your health care provider. Make sure you discuss any questions you have with your health care provider.   Document Released: 02/02/2012 Document Revised: 12/01/2014 Document Reviewed: 02/02/2012 Elsevier Interactive Patient Education 2016 Elsevier Inc. Sleep Apnea Sleep apnea is disorder that affects a person's sleep. A person with sleep apnea has abnormal pauses in their breathing when  they sleep. It is hard for them to get a good sleep. This makes a person tired during the day. It also can lead to other physical problems. There are three types of sleep apnea. One type is when breathing stops for a short time because your airway is blocked (obstructive sleep apnea). Another type is when the brain sometimes fails to give the normal signal to breathe to the muscles that control your breathing (central sleep apnea). The third type is a combination of the other two types. HOME CARE  Do not sleep on your back. Try to sleep on your side.  Take all medicine as told by your doctor.  Avoid alcohol, calming medicines (sedatives), and depressant drugs.  Try to lose weight if you are overweight. Talk to your doctor about a healthy weight goal. Your doctor may have you use a device that helps to open your airway. It can help you get the air that you need. It is called a positive airway pressure (PAP) device. There are three types of PAP devices:  Continuous positive airway pressure (CPAP) device.  Nasal expiratory positive airway pressure (EPAP) device.  Bilevel positive airway pressure (BPAP) device. MAKE SURE YOU:  Understand these instructions.  Will watch your condition.  Will get help right away if you are not doing well or get worse.   This information is not intended to replace advice given to you by your health care provider. Make sure you discuss any questions you have with your health  care provider.   Document Released: 08/19/2008 Document Revised: 12/01/2014 Document Reviewed: 03/13/2012 Elsevier Interactive Patient Education [employer]2016 Elsevier Inc.

## 2016-09-29 NOTE — Progress Notes (Signed)
SLEEP MED[facility]INE CLINIC   Provider:  Melvyn Novasarmen  Valentin Benney, M D  Referring Provider: Norm SaltVanstory, Ashley N, PA Primary Care Physician:  Norm SaltVANSTORY, ASHLEY N, PA  Chief Complaint  Patient presents with  . New Patient (Initial Visit)    snores at night, never had sleep study    HPI:  Lindsey Moon is a 20 y.o. female , seen here as a referral from PA Norva RiffleAshley Vanstory and NP Darrol Angelarolyn Martin.  [name] has a history of upper and lower back pain, frequent headaches allergic rhinitis, orbital obesity, acid reflux and left TMJ joint pain. Her headaches have been migraines she has at least 2-3 every month. Ibuprofen usually takes care of her headaches which are bifrontal , associated with photophobia and occasional preceded by a visual aura. Mrs. Dattilo has never been evaluated in a sleep study, but her headaches have been morning headaches, sleep related headaches. She also complains about excessive daytime sleepiness and endorsed the Epworth sleepiness score at 21 points. She has been using topiramate for migraine headache prevention.  Sleep habits are as follows: The patient sleeps in her own bedroom in her own bed alone. Her bedroom is described as quiet, cool and dark. She goes to bed around 10 or 11 PM but it will take her until 1 or 2 in the morning to sleep. She does watch TV in her bedroom at night. In spite of the prolonged sleep latency, she would only stay asleep for about 4 hours as she has to rise at 4:30 for work. She likes to sleep on her side or on her stomach, using 2 pillows.  Things that make her sleepy include  "dinner outside", driving longer distances, any mealtime makes her sleepy. She will take daytime naps of 1-2 hours after having meals.   Sleep medical history and family sleep history:  OSA in mother. Obesity in mother. Only child. Single.   Social history:  CNA work in daytime. 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 Used to work in [employer].  Non smoker, non ETOH  drinker, she drinks caffeine- coffee 2 , iced tea , 1 glass,  Cola /mountain dew - 4/week.  High School ; [employer] [place] , graduated 2015 .   Review of Systems: Out of a complete 14 system review, the patient complains of only the following symptoms, and all other reviewed systems are negative.   Epworth score  19- 21 , Fatigue severity score 50  , depression score 3/15    Social History   Social History  . Marital status: Single    Spouse name: N/A  . Number of children: 0  . Years of education: In college   Occupational History  . Student    Social History Main Topics  . Smoking status: Never Smoker  . Smokeless tobacco: Never Used  . Alcohol use No  . Drug use: No  . Sexual activity: Not Currently    Birth control/ protection: Injection   Other Topics Concern  . Not on file   Social History Narrative   ** Merged History Encounter **       Lives at home home with her mother. Right-handed. Drinks 2-4 sodas per day.    Family History  Problem Relation Age of Onset  . Diabetes Mother   . Hypertension Mother   . Mental illness Mother     Past Medical History:  Diagnosis Date  . Allergic rhinitis   . Diabetes mellitus without complication (HCC)   .  GERD (gastroesophageal reflux disease)   . Headache   . Obesity   . Scoliosis   . TMJ disease     Past Surgical History:  Procedure Laterality Date  . arm surgery Left     Current Outpatient Prescriptions  Medication Sig Dispense Refill  . docusate sodium (COLACE) 100 MG capsule Take 1 capsule (100 mg total) by mouth 2 (two) times daily. 90 capsule 1  . fluticasone (FLONASE) 50 MCG/ACT nasal spray Place 2 sprays into both nostrils daily. 9.9 g 2  . ibuprofen (ADVIL,MOTRIN) 800 MG tablet Take 800 mg by mouth every 6 (six) hours as needed. Pain.    Marland Kitchen. METHOCARBAMOL PO Take by mouth as needed. Reported on 02/13/2016    . omeprazole (PRILOSEC) 20 MG capsule Take 1 capsule (20 mg total) by mouth 2 (two) times daily  before a meal. 60 capsule 12  . rizatriptan (MAXALT-MLT) 5 MG disintegrating tablet Take 1 tablet (5 mg total) by mouth as needed. May repeat in 2 hours if needed 15 tablet 6  . cetirizine (ZYRTEC) 10 MG tablet Take 10 mg by mouth daily.    . cyclobenzaprine (FLEXERIL) 10 MG tablet Take 1 tablet (10 mg total) by mouth 3 (three) times daily as needed for muscle spasms. (Patient not taking: Reported on 09/29/2016) 30 tablet 0  . topiramate (TOPAMAX) 50 MG tablet One po bid xone week, then 2 tabs po bid (Patient not taking: Reported on 09/29/2016) 120 tablet 11  . traMADol (ULTRAM) 50 MG tablet Take by mouth every 6 (six) hours as needed. Reported on 02/08/2016     No current facility-administered medications for this visit.     Allergies as of 09/29/2016  . (No Known Allergies)    Vitals: BP 120/70   Pulse 80   Resp 20   Ht 5\' 3"  (1.6 m)   Wt 252 lb (114.3 kg)   BMI 44.64 kg/m  Last Weight:  Wt Readings from Last 1 Encounters:  09/29/16 252 lb (114.3 kg)   ZOX:WRUEBMI:Body mass index is 44.64 kg/m.     Last Height:   Ht Readings from Last 1 Encounters:  09/29/16 5\' 3"  (1.6 m)    Physical exam:  General: The patient is awake, alert and appears not in acute distress. The patient is well groomed. Head: Normocephalic, atraumatic. Neck is supple. Mallampati  neck circumference: 16.5 . Nasal airflow congested , TMJ click on the left over right evident . Retrognathia is seen.  Cardiovascular:  Regular rate and rhythm , without  murmurs or carotid bruit, and without distended neck veins. Respiratory: Lungs are clear to auscultation. Skin:  Without evidence of edema, or rash Trunk: BMI is 45 Neurologic exam : The patient is  alert, sluggish oriented to place and time.   Memory subjective described as intact.  Attention span & concentration ability appears limited  Speech is fluent, without  dysarthria, dysphonia or aphasia.  Mood and affect are appropriate.  Cranial nerves: Pupils are equal  and briskly reactive to light. Funduscopic exam without evidence of pallor or edema.  Extraocular movements  in vertical and horizontal planes intact and without nystagmus. Visual fields by finger perimetry are intact. Hearing to finger rub intact.   Facial sensation intact to fine touch.  Facial motor strength is symmetric and tongue and uvula move midline. Shoulder shrug was symmetrical.   Motor exam:  Normal tone, muscle bulk and symmetric strength in all extremities. Good grips and pinch strength in both hands. Sensory:  Fine touch, pinprick and vibration were tested in all extremities. Proprioception tested in the upper extremities was normal. Coordination:  Finger-to-nose maneuver  normal without evidence of ataxia, dysmetria or tremor.  Gait and station: Patient walks without assistive device and is able unassisted to climb up to the exam table. Strength within normal limits.  Stance is stable and normal.  Deep tendon reflexes: in the upper and lower extremities are symmetric and intact.   The patient was advised of the nature of the diagnosed sleep disorder , the treatment options and risks for general a health and wellness arising from not treating the condition.  I spent more than  40 minutes of face to face time with the patient. Greater than 50% of time was spent in counseling and coordination of care. We have discussed the diagnosis and differential and I answered the patient's questions.     Assessment:  After physical and neurologic examination, review of laboratory studies,  Personal review of imaging studies, reports of other /same  Imaging studies ,  Results of polysomnography/ neurophysiology testing and pre-existing records as far as provided in visit., my assessment is   1) Mrs. Spayd has definitely risk factors for obstructive sleep apnea including her body mass index, her neck circumference and her Mallampati. Symptoms of non-refreshing sleep are a high degree of daytime  sleepiness according to her Epworth score. Sleep related headaches, nocturia.    Her mother reports it was loudly snore and sometimes breathing irregular. Her mother is sure that she witnessed apnea. Given Heavan age, her main risk factor is obesity but she cannot wait until a diet may be successful in the meantime she should be tested for the degree of sleep apnea she has and started on CPAP.  Plan:  Treatment plan and additional workup : Attended split-night polysomnography to be ordered . Roseana reports sleep related headaches often wakes up with headaches in the mornings this can be a symptom of hypercapnia. Rv after sleep study.  Low carb diet information for morbid obesity.  Porfirio Mylar Ellamay Fors MD   Darrol Angel NP and  Dr Terrace Arabia, MD   09/29/2016   CC: Norm Salt, [place] [address] [place], [place] [address]

## 2016-10-02 IMAGING — DX DG ABDOMEN ACUTE W/ 1V CHEST
3 series · 3 of 3 positions shown · non-contrast
Comparison: Chest radiograph -[DATE]/ 7582; 06/06/2014

CLINICAL DATA: Umbilical abdominal pain. Nausea and diarrhea for 1
week.

EXAM:
DG ABDOMEN ACUTE W/ 1V CHEST

[chest pa]
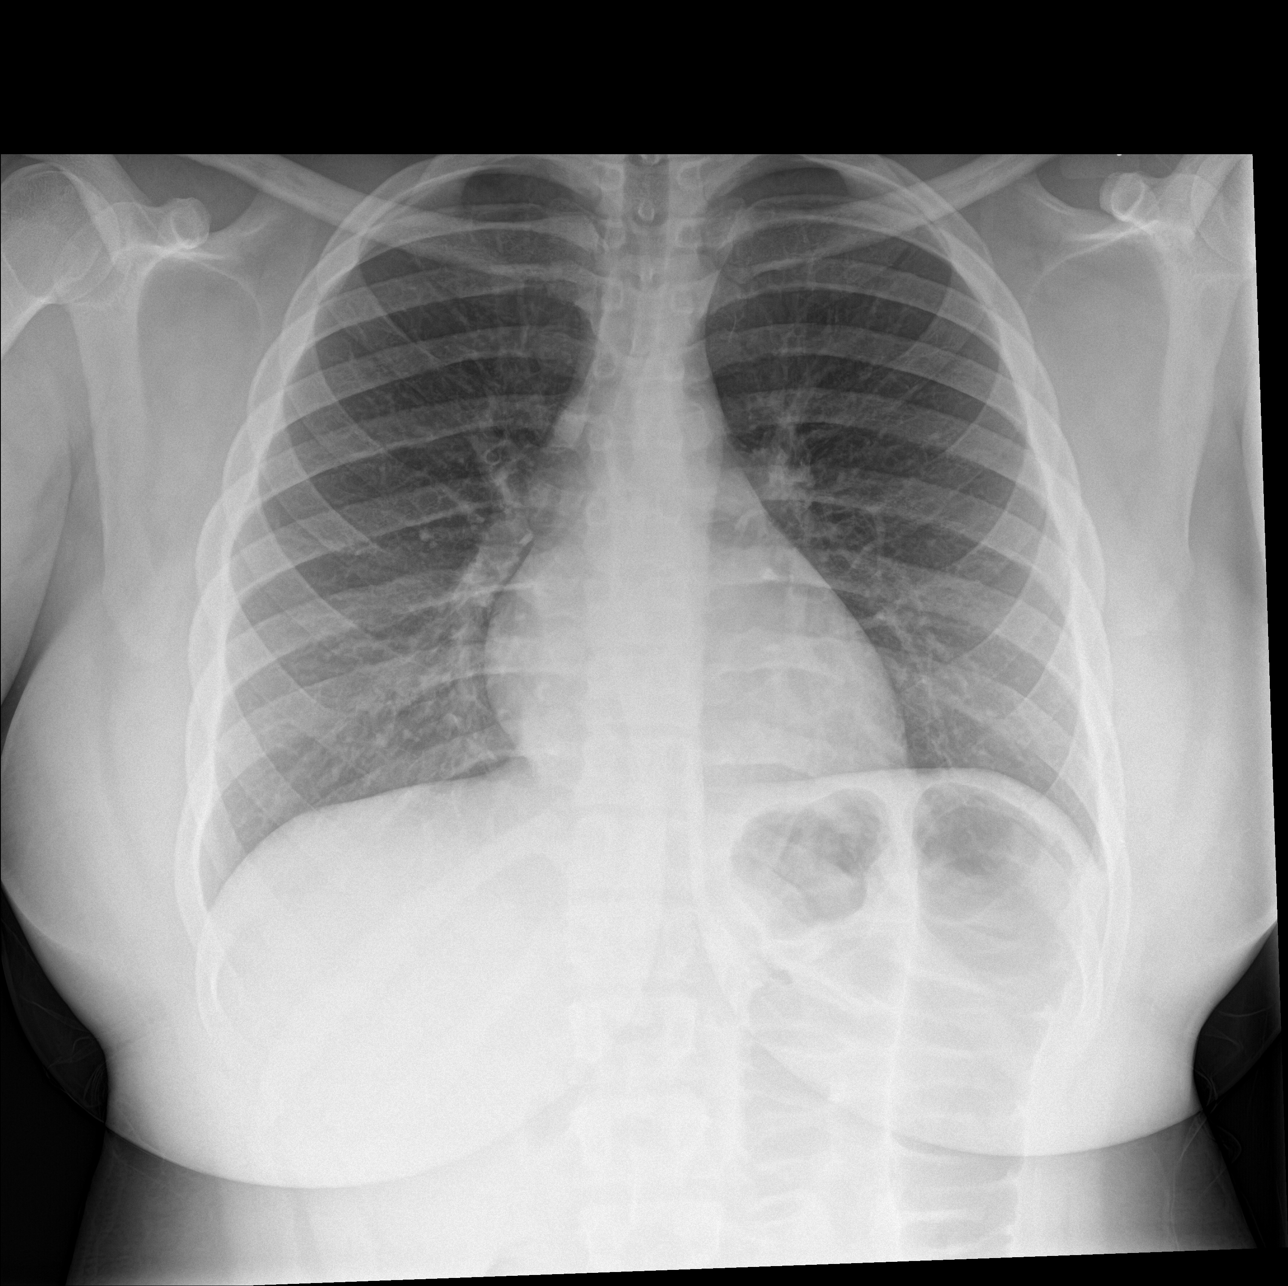

[abdomen erect]
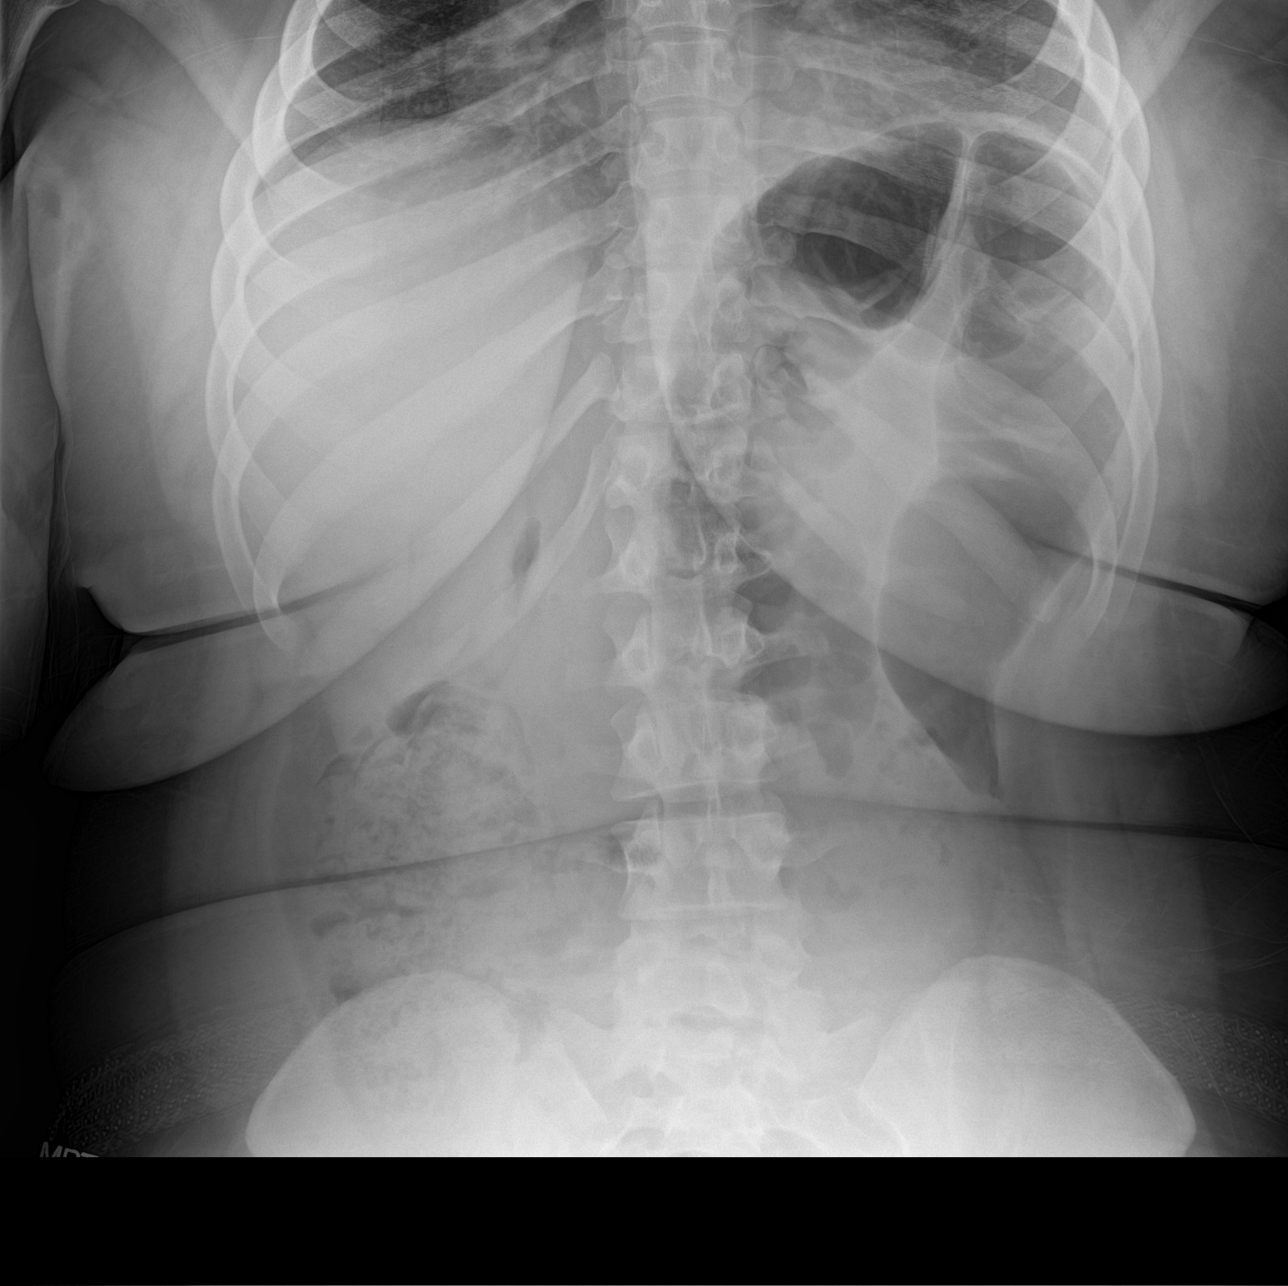

[abdomen supine]
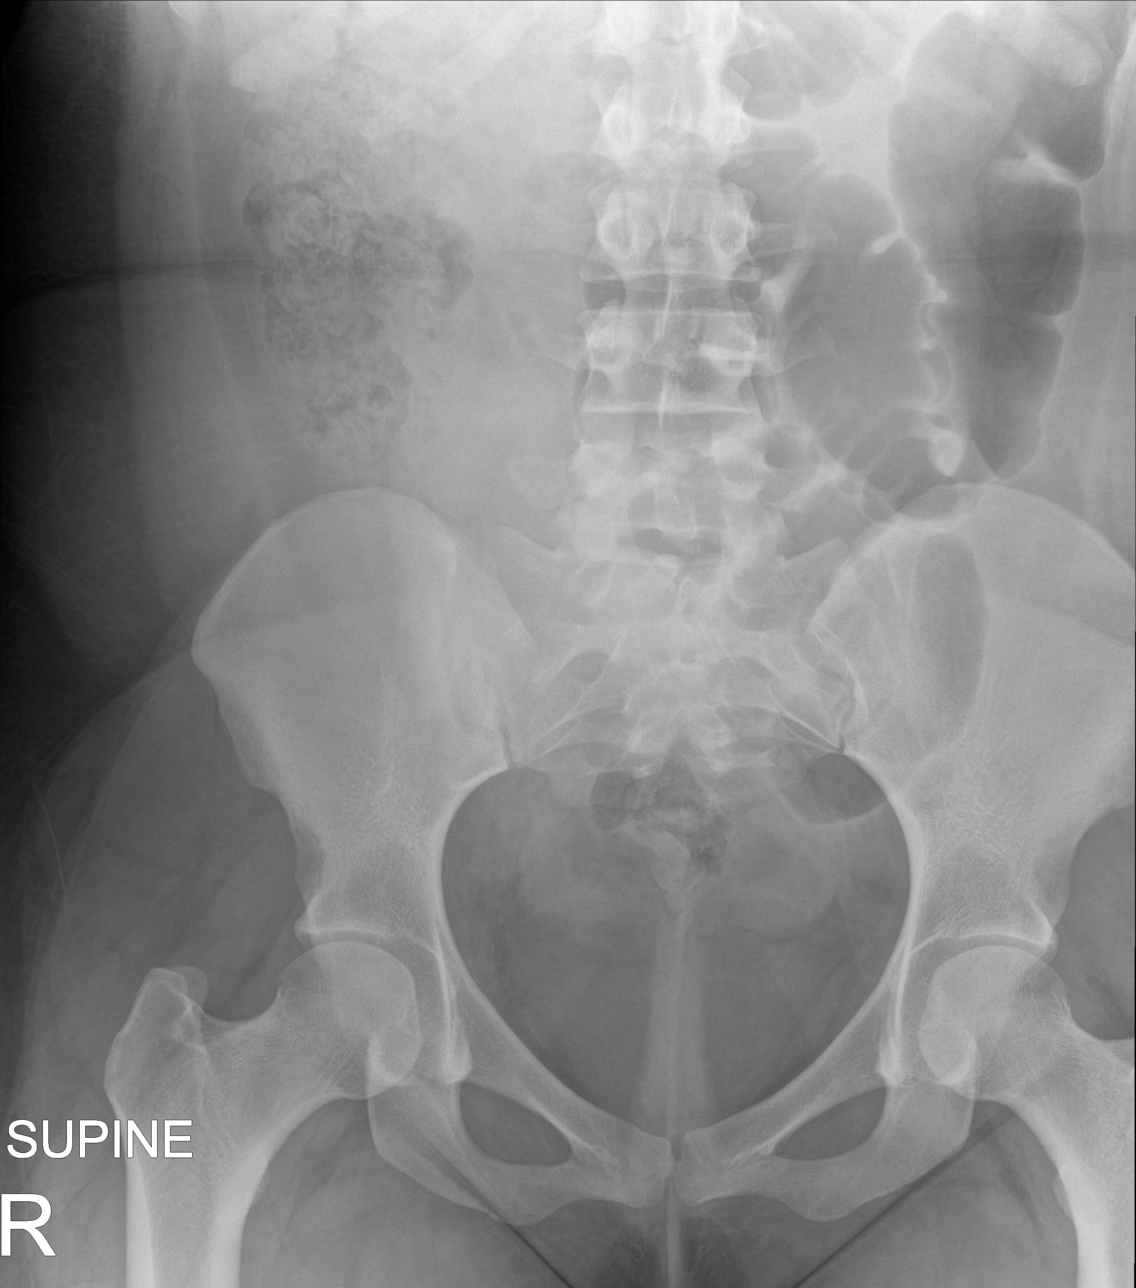

[3 of 3 positions shown; findings below may reference images not displayed]

FINDINGS: Grossly unchanged cardiac silhouette and mediastinal contours. No
focal parenchymal opacities. No pleural effusion or pneumothorax. No
evidence of edema.

Moderate colonic stool burden. There is mild gas distention of the
splenic flexure of the colon, similar to prior chest radiograph
performed [DATE]. Otherwise, there is a paucity of bowel gas. No
pneumoperitoneum, pneumatosis or portal venous gas.

No definite abnormal intra-abdominal calcifications.

No acute osseus abnormalities.
IMPRESSION: 1. No acute cardiopulmonary disease.
2. Moderate colonic stool burden.
3. Persistent moderate gas distention of the colon without evidence
of enteric obstruction.

## 2016-10-26 ENCOUNTER — Ambulatory Visit (INDEPENDENT_AMBULATORY_CARE_PROVIDER_SITE_OTHER): Payer: Medicaid Other | Admitting: Neurology

## 2016-10-26 DIAGNOSIS — R519 Headache, unspecified: Secondary | ICD-10-CM

## 2016-10-26 DIAGNOSIS — R51 Headache: Secondary | ICD-10-CM

## 2016-10-26 DIAGNOSIS — G4710 Hypersomnia, unspecified: Secondary | ICD-10-CM

## 2016-10-26 DIAGNOSIS — G4730 Sleep apnea, unspecified: Secondary | ICD-10-CM

## 2016-10-26 DIAGNOSIS — G43101 Migraine with aura, not intractable, with status migrainosus: Secondary | ICD-10-CM

## 2016-10-26 DIAGNOSIS — G4733 Obstructive sleep apnea (adult) (pediatric): Secondary | ICD-10-CM

## 2016-11-03 NOTE — Procedures (Signed)
PATIENT'S NAME:  Lindsey Moon, Lindsey Moon DOB:      1996/11/20      MR#:    [id number]     DATE OF RECORDING: 10/26/2016 REFERRING.:  Kristine RoyalAshley Van Story, PA Study Performed:   Baseline Polysomnogram HISTORY:  patient presented with headaches / migraines. She has at least 2-3 every month. Bi-frontal associated with photophobia and occasional preceded by a visual aura. Mrs. Rickenbach has never been evaluated in a sleep study, but her headaches have been morning headaches, sleep related headaches. She reports excessive daytime sleepiness, taking daytime naps of 1-2 hour duration after meals.   Allergic rhinitis, diabetes mellitus, GERD, migrainous headaches, super -obesity, scoliosis and TMJ click. The patient endorsed the Epworth Sleepiness Scale at 21/24 points.  The patient's weight 252 pounds with a height of 63 (inches), resulting in a BMI of 44.5 kg/m2.The patient's neck circumference measured 16.5 inches.  CURRENT MEDICATIONS: Docusate sodium, fluticasone, Ibuprofen, Methocarbamol, Omeprazole and Rizatriptan   PROCEDURE:  This is a multichannel digital polysomnogram utilizing the Somnostar 11.2 system.  Electrodes and sensors were applied and monitored per AASM Specifications.   EEG, EOG, Chin and Limb EMG, were sampled at 200 Hz.  ECG, Snore and Nasal Pressure, Thermal Airflow, Respiratory Effort, CPAP Flow and Pressure, Oximetry was sampled at 50 Hz. Digital video and audio were recorded.      BASELINE STUDY  Lights Out was at 21:27 and Lights On at 05:24.  Total recording time (TRT) was 478 minutes, with a total sleep time (TST) of 347.5 minutes.  The patient's sleep latency was 105 minutes. REM latency was 68 minutes. The sleep efficiency was 72.7 %.    WASO (Wake after sleep onset) was 20.5 minutes.  There were 15 minutes in Stage N1, 188 minutes Stage N2, 90 minutes Stage N3 and 54.5 minutes in Stage REM.  The percentage of Stage N1 was 4.3%, Stage N2 was 54.1%, Stage N3 was 25.9% and Stage R (REM  sleep) was 15.7%.   RESPIRATORY ANALYSIS:  There were a total of 38 respiratory events:  19 obstructive apneas, 0 central apneas and 19 hypopneas with 0 respiratory event related arousals (RERAs).     The total APNEA/HYPOPNEA INDEX (AHI) was 6.6/hour and the total RESPIRATORY DISTURBANCE INDEX was 6.6 /hour.  32 events occurred in REM sleep and 12 events in NREM. The REM AHI was 35.2 /hour, versus a non-REM AHI of 1.2. The patient spent 174.5 minutes of total sleep time in the supine position and 173 minutes in non-supine. The supine AHI was 11.3 versus a non-supine AHI of 1.7.  OXYGEN SATURATION & C02:  The Wake baseline 02 saturation was 97%, with the lowest being 84%. Time spent below 89% saturation equaled 3 minutes. Average End Tidal CO2 during sleep was 38.6 torr.   PERIODIC LIMB MOVEMENTS:  The patient had a total of 0 Periodic Limb Movements.      The patient took no bathroom breaks. Mild Snoring was noted. EKG was in keeping with normal sinus rhythm (NSR).  IMPRESSION: very mild OSA, with strong dependence on supine sleep position. No significant cardiac rhythm variation, PLMs or hypoxemia. Mild snoring recorded.  RECOMMENDATIONS: The reported degree of apnea is too mild to justify CPAP use. I will ask the patient to avoid supine sleep position, will explain the tennis ball method. Snoring was mild , if treatment is desired consider a dental device.  Hypoxemia and hypercapnia were not present, and no explanation for sleep related headaches was found.  The patient is urged to enroll in a medical weight loss program, and referral can be made through PCP.    1. Avoid sedative-hypnotics which may worsen sleep apnea, alcohol and tobacco (as applicable). 2. Advise to lose weight, diet and exercise if not contraindicated (BMI 45). 3. Further information regarding OSA may be obtained from [employer] (www.sleepfoundation.org) or American Sleep Apnea Association  (www.sleepapnea.org). 4. Avoid caffeine-containing beverages and chocolate. 5. Consider dedicated sleep psychology referral if insomnia is of clinical concern.   6. A follow up appointment will be scheduled in the Sleep Clinic at [facility]. The referring provider will be notified of the results.      I certify that I have reviewed the entire raw data recording prior to the issuance of this report in accordance with the Standards of Accreditation of the American Academy of Sleep Medicine (AASM)      Melvyn Novasarmen Haralambos Yeatts, MD  11-03-2016 Diplomat, American Board of Psychiatry and Neurology  Diplomat, American Board of Sleep Medicine Medical Director, [facility]

## 2016-11-04 ENCOUNTER — Telehealth: Payer: Self-pay

## 2016-11-04 NOTE — Telephone Encounter (Signed)
-----   Message from Lindsey Novasarmen Dohmeier, MD sent at 11/03/2016  5:23 PM EST ----- No CPAP necessary, mild apnea is positional dependent. Patient should avoid sleeping on her back ( tennis ball ) and is to lose weight.  May follow up if requested. Can see NP . CD

## 2016-11-04 NOTE — Telephone Encounter (Signed)
LM for patient with results and recommendations below. I left call back number if she would like to make f/u with Dr. Vickey Hugerohmeier to discuss further if she would like. I sent copy to PCP. I reminded her of f/u appt with Eber Jonesarolyn NP next year.

## 2016-12-01 IMAGING — DX DG ABDOMEN 1V
1 series · 1 of 1 positions shown · non-contrast
Comparison: 11/13/2015

CLINICAL DATA: Constipation with nausea and vomiting.

EXAM:
ABDOMEN - 1 VIEW

[abdomen kub]
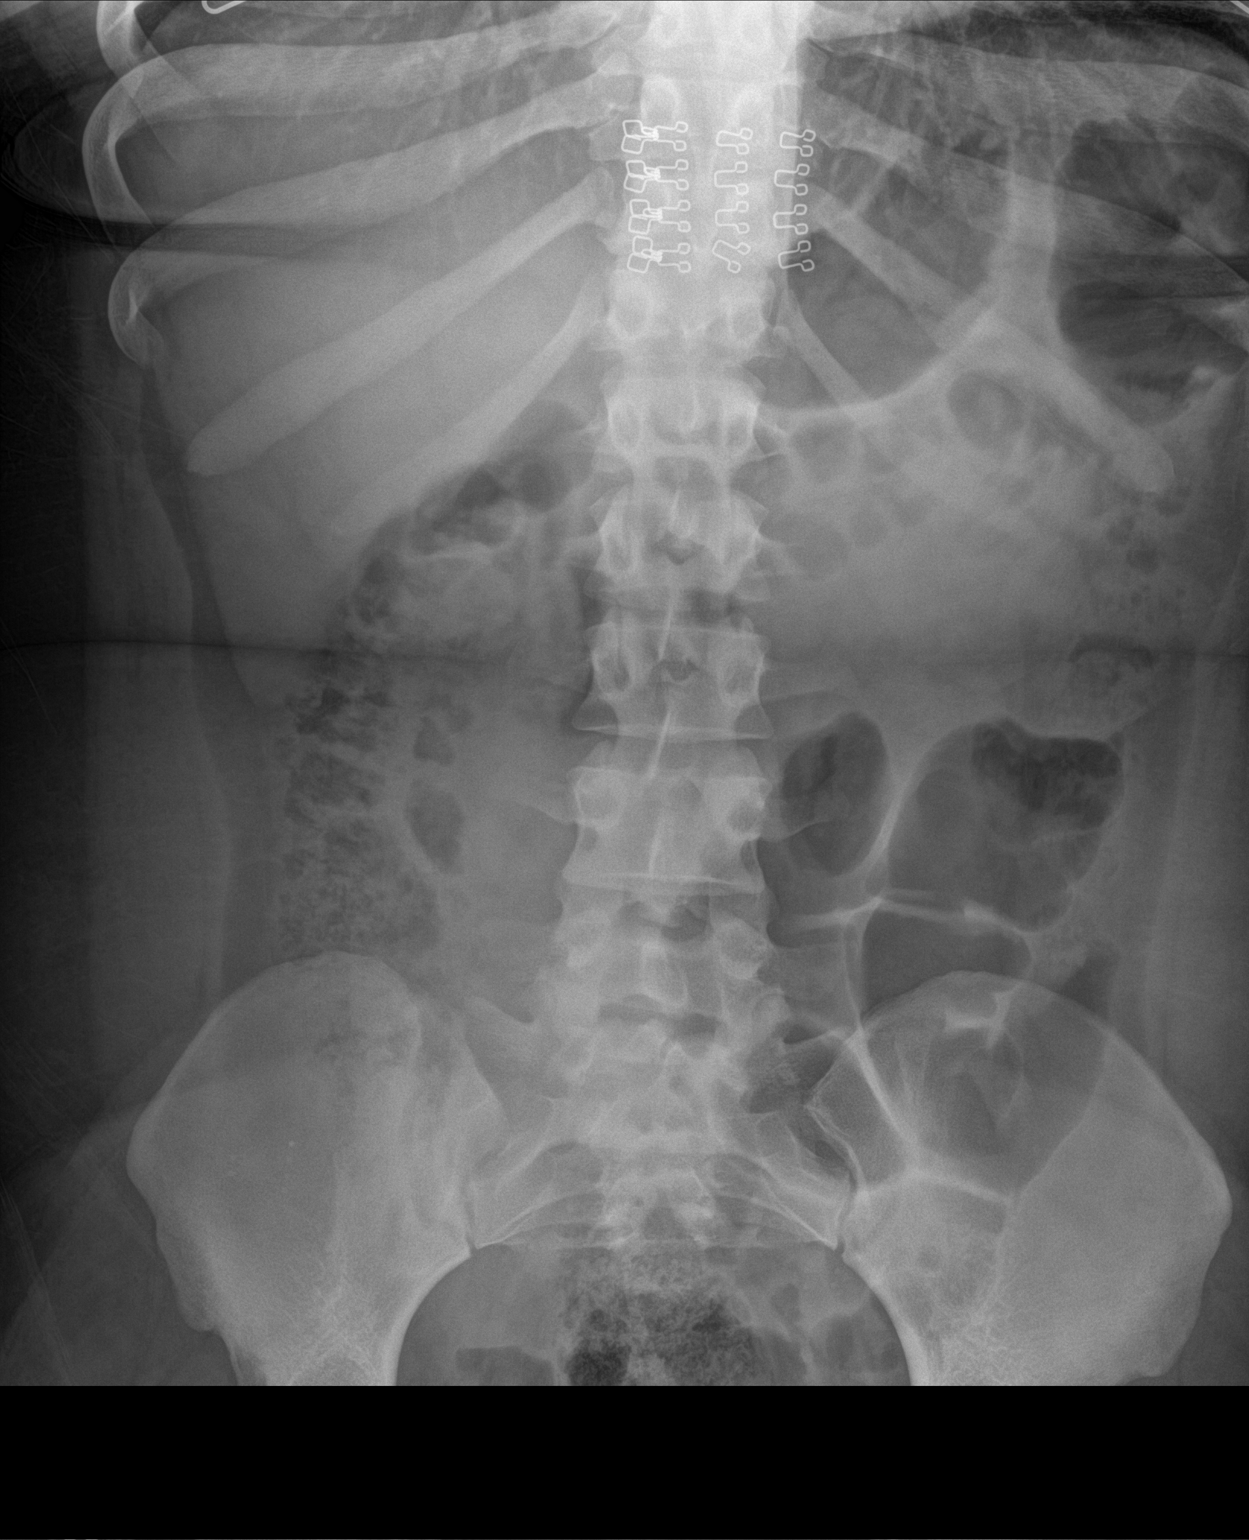

[1 of 1 positions shown; findings below may reference images not displayed]

FINDINGS: Examination demonstrates mild fecal retention over the right colon,
splenic flexure and rectosigmoid colon. No evidence of obstruction.
Remainder the exam is unchanged.
IMPRESSION: Nonobstructive bowel gas pattern with mild fecal retention over the
right colon, splenic flexure and rectosigmoid colon.

## 2017-02-19 ENCOUNTER — Other Ambulatory Visit (HOSPITAL_COMMUNITY)
Admission: RE | Admit: 2017-02-19 | Discharge: 2017-02-19 | Disposition: A | Discharge status: Home / Self Care | Payer: Medicaid Other | Source: Ambulatory Visit | Attending: Obstetrics | Admitting: Obstetrics

## 2017-02-19 ENCOUNTER — Ambulatory Visit (INDEPENDENT_AMBULATORY_CARE_PROVIDER_SITE_OTHER): Payer: Medicaid Other | Admitting: Obstetrics

## 2017-02-19 ENCOUNTER — Encounter: Payer: Self-pay | Admitting: Obstetrics

## 2017-02-19 DIAGNOSIS — Z113 Encounter for screening for infections with a predominantly sexual mode of transmission: Secondary | ICD-10-CM

## 2017-02-19 DIAGNOSIS — Z01419 Encounter for gynecological examination (general) (routine) without abnormal findings: Secondary | ICD-10-CM

## 2017-02-19 DIAGNOSIS — Z30431 Encounter for routine checking of intrauterine contraceptive device: Secondary | ICD-10-CM

## 2017-02-19 DIAGNOSIS — Z0000 Encounter for general adult medical examination without abnormal findings: Secondary | ICD-10-CM

## 2017-02-19 DIAGNOSIS — N898 Other specified noninflammatory disorders of vagina: Secondary | ICD-10-CM

## 2017-02-19 NOTE — Progress Notes (Signed)
Pt presents for annual, pap, and STD testing. No concerns per pt.

## 2017-02-19 NOTE — Progress Notes (Signed)
Subjective:        Lindsey Moon is a 21 y.o. female here for a routine exam.  Current complaints: None.    Personal health questionnaire:  Is patient Lindsey Moon, have a family history of breast and/or ovarian cancer: no Is there a family history of uterine cancer diagnosed at age < 8550, gastrointestinal cancer, urinary tract cancer, family member who is a Personnel officerLynch syndrome-associated carrier: no Is the patient overweight and hypertensive, family history of diabetes, personal history of gestational diabetes, preeclampsia or PCOS: no Is patient over 8455, have PCOS,  family history of premature CHD under age 22, diabetes, smoke, have hypertension or peripheral artery disease:  no At any time, has a partner hit, kicked or otherwise hurt or frightened you?: no Over the past 2 weeks, have you felt down, depressed or hopeless?: no Over the past 2 weeks, have you felt little interest or pleasure in doing things?:no   Gynecologic History No LMP recorded. Patient is not currently having periods (Reason: IUD). Contraception: IUD Last Pap: n/a. Results were: n/a Last mammogram: n/a. Results were: n/a  Obstetric History OB History  Gravida Para Term Preterm AB Living  0 0 0 0 0 0  SAB TAB Ectopic Multiple Live Births  0 0 0 0 0        Past Medical History:  Diagnosis Date  . Allergic rhinitis   . Diabetes mellitus without complication (HCC)   . GERD (gastroesophageal reflux disease)   . Headache   . Obesity   . Scoliosis   . TMJ disease     Past Surgical History:  Procedure Laterality Date  . arm surgery Left      Current Outpatient Prescriptions:  .  ibuprofen (ADVIL,MOTRIN) 800 MG tablet, Take 800 mg by mouth every 6 (six) hours as needed. Pain., Disp: , Rfl:  .  Levonorgestrel (KYLEENA) 19.5 MG IUD, by Intrauterine route. Inserted 02/08/16, Disp: , Rfl:  .  cetirizine (ZYRTEC) 10 MG tablet, Take 10 mg by mouth daily., Disp: , Rfl:  .  cyclobenzaprine (FLEXERIL) 10  MG tablet, Take 1 tablet (10 mg total) by mouth 3 (three) times daily as needed for muscle spasms. (Patient not taking: Reported on 09/29/2016), Disp: 30 tablet, Rfl: 0 .  docusate sodium (COLACE) 100 MG capsule, Take 1 capsule (100 mg total) by mouth 2 (two) times daily. (Patient not taking: Reported on 02/19/2017), Disp: 90 capsule, Rfl: 1 .  fluticasone (FLONASE) 50 MCG/ACT nasal spray, Place 2 sprays into both nostrils daily. (Patient not taking: Reported on 02/19/2017), Disp: 9.9 g, Rfl: 2 .  METHOCARBAMOL PO, Take by mouth as needed. Reported on 02/13/2016, Disp: , Rfl:  .  omeprazole (PRILOSEC) 20 MG capsule, Take 1 capsule (20 mg total) by mouth 2 (two) times daily before a meal. (Patient not taking: Reported on 02/19/2017), Disp: 60 capsule, Rfl: 12 .  rizatriptan (MAXALT-MLT) 5 MG disintegrating tablet, Take 1 tablet (5 mg total) by mouth as needed. May repeat in 2 hours if needed (Patient not taking: Reported on 02/19/2017), Disp: 15 tablet, Rfl: 6 .  topiramate (TOPAMAX) 50 MG tablet, One po bid xone week, then 2 tabs po bid (Patient not taking: Reported on 09/29/2016), Disp: 120 tablet, Rfl: 11 .  traMADol (ULTRAM) 50 MG tablet, Take by mouth every 6 (six) hours as needed. Reported on 02/08/2016, Disp: , Rfl:  No Known Allergies  Social History  Substance Use Topics  . Smoking status: Never Smoker  . Smokeless  tobacco: Never Used  . Alcohol use No    Family History  Problem Relation Age of Onset  . Diabetes Mother   . Hypertension Mother   . Mental illness Mother       Review of Systems  Constitutional: negative for fatigue and weight loss Respiratory: negative for cough and wheezing Cardiovascular: negative for chest pain, fatigue and palpitations Gastrointestinal: negative for abdominal pain and change in bowel habits Musculoskeletal:negative for myalgias Neurological: negative for gait problems and tremors Behavioral/Psych: negative for abusive relationship,  depression Endocrine: negative for temperature intolerance    Genitourinary:negative for abnormal menstrual periods, genital lesions, hot flashes, sexual problems and vaginal discharge Integument/breast: negative for breast lump, breast tenderness, nipple discharge and skin lesion(s)    Objective:       There were no vitals taken for this visit. General:   alert  Skin:   no rash or abnormalities  Lungs:   clear to auscultation bilaterally  Heart:   regular rate and rhythm, S1, S2 normal, no murmur, click, rub or gallop  Breasts:   normal without suspicious masses, skin or nipple changes or axillary nodes  Abdomen:  normal findings: no organomegaly, soft, non-tender and no hernia  Pelvis:  External genitalia: normal general appearance Urinary system: urethral meatus normal and bladder without fullness, nontender Vaginal: normal without tenderness, induration or masses Cervix: normal appearance Adnexa: normal bimanual exam Uterus: anteverted and non-tender, normal size   Lab Review Urine pregnancy test Labs reviewed yes Radiologic studies reviewed no  50% of 20 min visit spent on counseling and coordination of care.    Assessment:    Healthy female exam.    Contraceptive Surveillance.  Pleased with IUD.   Plan:    Education reviewed: depression evaluation, low fat, low cholesterol diet, safe sex/STD prevention, self breast exams and weight bearing exercise. Contraception: IUD. Follow up in: 2 years.   Meds ordered this encounter  Medications  . Levonorgestrel (KYLEENA) 19.5 MG IUD    Sig: by Intrauterine route. Inserted 02/08/16   Orders Placed This Encounter  Procedures  . Hepatitis B surface antigen  . Hepatitis C antibody  . HIV antibody  . RPR     Patient ID: Lindsey Moon, female   DOB: 1996/03/03, 21 y.o.   MRN: [id number]

## 2017-02-20 LAB — HEPATITIS C ANTIBODY

## 2017-02-20 LAB — HIV ANTIBODY (ROUTINE TESTING W REFLEX): HIV SCREEN 4TH GENERATION: NONREACTIVE

## 2017-02-20 LAB — CERVICOVAGINAL ANCILLARY ONLY
Bacterial vaginitis: POSITIVE — AB
CHLAMYDIA, DNA PROBE: NEGATIVE
Candida vaginitis: NEGATIVE
NEISSERIA GONORRHEA: NEGATIVE
Trichomonas: NEGATIVE

## 2017-02-20 LAB — HEPATITIS B SURFACE ANTIGEN: Hepatitis B Surface Ag: NEGATIVE

## 2017-02-20 LAB — RPR: RPR Ser Ql: NONREACTIVE

## 2017-02-21 ENCOUNTER — Other Ambulatory Visit: Payer: Self-pay | Admitting: Obstetrics

## 2017-02-21 DIAGNOSIS — N760 Acute vaginitis: Principal | ICD-10-CM

## 2017-02-21 DIAGNOSIS — B9689 Other specified bacterial agents as the cause of diseases classified elsewhere: Secondary | ICD-10-CM

## 2017-02-21 MED ORDER — METRONIDAZOLE 500 MG PO TABS: 500.0000 mg | ORAL_TABLET | Freq: Two times a day (BID) | ORAL | 2 refills | Status: DC

## 2017-02-24 LAB — CYTOLOGY - PAP: Diagnosis: NEGATIVE

## 2017-03-12 ENCOUNTER — Ambulatory Visit: Payer: Self-pay | Admitting: Nurse Practitioner

## 2017-11-05 ENCOUNTER — Ambulatory Visit (INDEPENDENT_AMBULATORY_CARE_PROVIDER_SITE_OTHER): Payer: Medicaid Other | Admitting: Obstetrics & Gynecology

## 2017-11-05 ENCOUNTER — Encounter: Payer: Self-pay | Admitting: Obstetrics & Gynecology

## 2017-11-05 VITALS — BP 133/89 | HR 102 | Wt 257.0 lb

## 2017-11-05 DIAGNOSIS — Z309 Encounter for contraceptive management, unspecified: Secondary | ICD-10-CM | POA: Diagnosis not present

## 2017-11-05 DIAGNOSIS — T8332XA Displacement of intrauterine contraceptive device, initial encounter: Secondary | ICD-10-CM

## 2017-11-05 MED ORDER — METRONIDAZOLE 0.75 % VA GEL: 1.0000 | Freq: Every day | VAGINAL | 1 refills | Status: DC

## 2017-11-05 NOTE — Progress Notes (Signed)
Patient ID: Lindsey Moon, female   DOB: 19-Apr-1996, 21 y.o.   MRN: [id number]  Chief Complaint  Patient presents with  . Contraception    IUD CHECK     HPI Lindsey Moon is a 21 y.o. single African American G0  female. [facility]. At that time they told her that her Rutha BouchardKyleena strings were not visible. She is here to find out if her IUD is in place. She has some occasional spotting. HPI  Past Medical History:  Diagnosis Date  . Allergic rhinitis   . Diabetes mellitus without complication (HCC)   . GERD (gastroesophageal reflux disease)   . Headache   . Obesity   . Scoliosis   . TMJ disease     Past Surgical History:  Procedure Laterality Date  . arm surgery Left     Family History  Problem Relation Age of Onset  . Diabetes Mother   . Hypertension Mother   . Mental illness Mother     Social History Social History   Tobacco Use  . Smoking status: Never Smoker  . Smokeless tobacco: Never Used  Substance Use Topics  . Alcohol use: No    Alcohol/week: 0.0 oz  . Drug use: No    No Known Allergies  Current Outpatient Medications  Medication Sig Dispense Refill  . Levonorgestrel (KYLEENA) 19.5 MG IUD by Intrauterine route. Inserted 02/08/16    . cetirizine (ZYRTEC) 10 MG tablet Take 10 mg by mouth daily.    . cyclobenzaprine (FLEXERIL) 10 MG tablet Take 1 tablet (10 mg total) by mouth 3 (three) times daily as needed for muscle spasms. (Patient not taking: Reported on 09/29/2016) 30 tablet 0  . docusate sodium (COLACE) 100 MG capsule Take 1 capsule (100 mg total) by mouth 2 (two) times daily. (Patient not taking: Reported on 02/19/2017) 90 capsule 1  . fluticasone (FLONASE) 50 MCG/ACT nasal spray Place 2 sprays into both nostrils daily. (Patient not taking: Reported on 02/19/2017) 9.9 g 2  . ibuprofen (ADVIL,MOTRIN) 800 MG tablet Take 800 mg by mouth every 6 (six) hours as needed. Pain.    Marland Kitchen. METHOCARBAMOL PO Take by mouth  as needed. Reported on 02/13/2016    . metroNIDAZOLE (FLAGYL) 500 MG tablet Take 1 tablet (500 mg total) by mouth 2 (two) times daily. (Patient not taking: Reported on 11/05/2017) 14 tablet 2  . omeprazole (PRILOSEC) 20 MG capsule Take 1 capsule (20 mg total) by mouth 2 (two) times daily before a meal. (Patient not taking: Reported on 02/19/2017) 60 capsule 12  . rizatriptan (MAXALT-MLT) 5 MG disintegrating tablet Take 1 tablet (5 mg total) by mouth as needed. May repeat in 2 hours if needed (Patient not taking: Reported on 02/19/2017) 15 tablet 6  . topiramate (TOPAMAX) 50 MG tablet One po bid xone week, then 2 tabs po bid (Patient not taking: Reported on 09/29/2016) 120 tablet 11  . traMADol (ULTRAM) 50 MG tablet Take by mouth every 6 (six) hours as needed. Reported on 02/08/2016     No current facility-administered medications for this visit.     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  Physical Exam Physical Exam Breathing, conversing, and ambulating normally Vaginal odor c/w BV Bedside u/s showed IUD in the correct spot Strings not seen  with speculum exam Data Reviewed   Assessment    BV- metrogel per patient request Contraception- reassurance given regarding Rutha BouchardKyleena It was placed 3/17 and will need to be replaced/removed 3/20    Plan    As above       Mairim Bade C Boss Danielsen 11/05/2017, 1:47 PM

## 2017-11-05 NOTE — Progress Notes (Signed)
[facility] [place] and [facility]D screening  Pt has not been made aware of results.  During pap /exam pt was told strings were not visualized. [facility]. C/O:abdominal pain since Sunday, dysuria Pt was sent Rx for UTI has not picked up medication.

## 2018-03-01 ENCOUNTER — Telehealth: Payer: Self-pay

## 2018-03-01 NOTE — Telephone Encounter (Signed)
Patient called wanted to know the exact date her Lindsey BouchardKyleena was placed.

## 2018-03-08 ENCOUNTER — Encounter: Payer: Self-pay | Admitting: Obstetrics

## 2018-03-08 ENCOUNTER — Other Ambulatory Visit (HOSPITAL_COMMUNITY)
Admission: RE | Admit: 2018-03-08 | Discharge: 2018-03-08 | Disposition: A | Discharge status: Home / Self Care | Payer: Medicaid Other | Source: Ambulatory Visit | Attending: Obstetrics | Admitting: Obstetrics

## 2018-03-08 ENCOUNTER — Ambulatory Visit (INDEPENDENT_AMBULATORY_CARE_PROVIDER_SITE_OTHER): Payer: Medicaid Other | Admitting: Obstetrics

## 2018-03-08 VITALS — BP 120/87 | HR 90 | Wt 265.0 lb

## 2018-03-08 DIAGNOSIS — Z309 Encounter for contraceptive management, unspecified: Secondary | ICD-10-CM | POA: Diagnosis not present

## 2018-03-08 DIAGNOSIS — Z01419 Encounter for gynecological examination (general) (routine) without abnormal findings: Secondary | ICD-10-CM | POA: Insufficient documentation

## 2018-03-08 DIAGNOSIS — Z6841 Body Mass Index (BMI) 40.0 and over, adult: Secondary | ICD-10-CM

## 2018-03-08 DIAGNOSIS — Z30431 Encounter for routine checking of intrauterine contraceptive device: Secondary | ICD-10-CM

## 2018-03-08 NOTE — Progress Notes (Signed)
Subjective:        Lindsey Moon is a 22 y.o. female here for a routine exam.  Current complaints: None.    Personal health questionnaire:  Is patient Ashkenazi Jewish, have a family history of breast and/or ovarian cancer: no Is there a family history of uterine cancer diagnosed at age < 76, gastrointestinal cancer, urinary tract cancer, family member who is a Personnel officer syndrome-associated carrier: no Is the patient overweight and hypertensive, family history of diabetes, personal history of gestational diabetes, preeclampsia or PCOS: no Is patient over 2, have PCOS,  family history of premature CHD under age 78, diabetes, smoke, have hypertension or peripheral artery disease:  no At any time, has a partner hit, kicked or otherwise hurt or frightened you?: no Over the past 2 weeks, have you felt down, depressed or hopeless?: no Over the past 2 weeks, have you felt little interest or pleasure in doing things?:no   Gynecologic History No LMP recorded. (Menstrual status: IUD). Contraception: IUD Rutha Bouchard ) Last Pap: 2018. Results were: normal Last mammogram: n/a. Results were: n/a  Obstetric History OB History  Gravida Para Term Preterm AB Living  0 0 0 0 0 0  SAB TAB Ectopic Multiple Live Births  0 0 0 0 0    Past Medical History:  Diagnosis Date  . Allergic rhinitis   . Diabetes mellitus without complication (HCC)   . GERD (gastroesophageal reflux disease)   . Headache   . Obesity   . Scoliosis   . TMJ disease     Past Surgical History:  Procedure Laterality Date  . arm surgery Left      Current Outpatient Medications:  .  Levonorgestrel (KYLEENA) 19.5 MG IUD, by Intrauterine route. Inserted 02/08/16, Disp: , Rfl:  .  metroNIDAZOLE (FLAGYL) 500 MG tablet, Take 1 tablet (500 mg total) by mouth 2 (two) times daily., Disp: 14 tablet, Rfl: 2 .  cetirizine (ZYRTEC) 10 MG tablet, Take 10 mg by mouth daily., Disp: , Rfl:  .  cyclobenzaprine (FLEXERIL) 10 MG tablet,  Take 1 tablet (10 mg total) by mouth 3 (three) times daily as needed for muscle spasms. (Patient not taking: Reported on 09/29/2016), Disp: 30 tablet, Rfl: 0 .  ibuprofen (ADVIL,MOTRIN) 800 MG tablet, Take 800 mg by mouth every 6 (six) hours as needed. Pain., Disp: , Rfl:  .  rizatriptan (MAXALT-MLT) 5 MG disintegrating tablet, Take 1 tablet (5 mg total) by mouth as needed. May repeat in 2 hours if needed (Patient not taking: Reported on 02/19/2017), Disp: 15 tablet, Rfl: 6 .  topiramate (TOPAMAX) 50 MG tablet, One po bid xone week, then 2 tabs po bid (Patient not taking: Reported on 09/29/2016), Disp: 120 tablet, Rfl: 11 No Known Allergies  Social History   Tobacco Use  . Smoking status: Never Smoker  . Smokeless tobacco: Never Used  Substance Use Topics  . Alcohol use: No    Alcohol/week: 0.0 oz    Family History  Problem Relation Age of Onset  . Diabetes Mother   . Hypertension Mother   . Mental illness Mother       Review of Systems  Constitutional: negative for fatigue and weight loss Respiratory: negative for cough and wheezing Cardiovascular: negative for chest pain, fatigue and palpitations Gastrointestinal: negative for abdominal pain and change in bowel habits Musculoskeletal:negative for myalgias Neurological: negative for gait problems and tremors Behavioral/Psych: negative for abusive relationship, depression Endocrine: negative for temperature intolerance    Genitourinary:negative for  abnormal menstrual periods, genital lesions, hot flashes, sexual problems and vaginal discharge Integument/breast: negative for breast lump, breast tenderness, nipple discharge and skin lesion(s)    Objective:       BP 120/87   Pulse 90   Wt 265 lb (120.2 kg)   BMI 46.94 kg/m  General:   alert  Skin:   no rash or abnormalities  Lungs:   clear to auscultation bilaterally  Heart:   regular rate and rhythm, S1, S2 normal, no murmur, click, rub or gallop  Breasts:   normal without  suspicious masses, skin or nipple changes or axillary nodes  Abdomen:  normal findings: no organomegaly, soft, non-tender and no hernia  Pelvis:  External genitalia: normal general appearance Urinary system: urethral meatus normal and bladder without fullness, nontender Vaginal: normal without tenderness, induration or masses Cervix: normal appearance Adnexa: normal bimanual exam Uterus: anteverted and non-tender, normal size   Lab Review Urine pregnancy test Labs reviewed yes Radiologic studies reviewed yes  50% of 20 min visit spent on counseling and coordination of care.   Assessment:     1. Encounter for gynecological examination and pap smear of cervix Rx: - Cytology - PAP  2. IUD check up - IUD string not visible, but recent ultrasound verifies that IUD in normal intrauterine position by Dr. Marice Potterove on last office visit 10/2017.  3. Obesity - program of caloric reduction, exercise and behavioral modification recommended    Plan:    Education reviewed: calcium supplements, depression evaluation, low fat, low cholesterol diet, safe sex/STD prevention, self breast exams and weight bearing exercise. Contraception: IUD. Follow up in: 1 year.    No orders of the defined types were placed in this encounter.  No orders of the defined types were placed in this encounter.   Brock BadHARLES A. Enedina Pair MD 03-08-2018

## 2018-03-09 LAB — CYTOLOGY - PAP: Diagnosis: NEGATIVE

## 2018-08-31 ENCOUNTER — Encounter: Payer: Self-pay | Admitting: Obstetrics

## 2018-08-31 ENCOUNTER — Ambulatory Visit (INDEPENDENT_AMBULATORY_CARE_PROVIDER_SITE_OTHER): Payer: Medicaid Other | Admitting: Obstetrics

## 2018-08-31 VITALS — BP 136/84 | HR 90 | Ht 63.0 in | Wt 266.6 lb

## 2018-08-31 DIAGNOSIS — T8332XD Displacement of intrauterine contraceptive device, subsequent encounter: Secondary | ICD-10-CM

## 2018-08-31 DIAGNOSIS — Z30431 Encounter for routine checking of intrauterine contraceptive device: Secondary | ICD-10-CM

## 2018-08-31 DIAGNOSIS — B9689 Other specified bacterial agents as the cause of diseases classified elsewhere: Secondary | ICD-10-CM

## 2018-08-31 DIAGNOSIS — N760 Acute vaginitis: Secondary | ICD-10-CM

## 2018-08-31 DIAGNOSIS — Z309 Encounter for contraceptive management, unspecified: Secondary | ICD-10-CM

## 2018-08-31 MED ORDER — METRONIDAZOLE 1.3 % VA GEL: 1.0000 | Freq: Every day | VAGINAL | 0 refills | Status: DC

## 2018-08-31 NOTE — Progress Notes (Signed)
Pt is in the office with IUD complications, inserted 02-08-16. Pt states that IUD feels like it has shifted, cannot feel strings, reports abdominal/pelvic.

## 2018-08-31 NOTE — Progress Notes (Signed)
Subjective:    Lindsey Moon is a 22 y.o. female who presents for IUD check up. The patient thinks that her IUD may have shifted because she can't feel the strings and she is having abdominopelvic pain. The patient is sexually active. Pertinent past medical history: none.  The information documented in the HPI was reviewed and verified.  Menstrual History: OB History    Gravida  0   Para  0   Term  0   Preterm  0   AB  0   Living  0     SAB  0   TAB  0   Ectopic  0   Multiple  0   Live Births  0            No LMP recorded. (Menstrual status: IUD).   Patient Active Problem List   Diagnosis Date Noted  . Morning headache 09/11/2016  . Somnolence, daytime 09/11/2016  . Encounter for IUD insertion 02/08/2016  . Chronic migraine 12/19/2015  . Back pain 12/19/2015  . 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 Allergic rhinitis   . Diabetes mellitus without complication (HCC)   . GERD (gastroesophageal reflux disease)   . Headache   . Obesity   . Scoliosis   . TMJ disease     Past Surgical History:  Procedure Laterality Date  . arm surgery Left      Current Outpatient Medications:  .  Levonorgestrel (KYLEENA) 19.5 MG IUD, by Intrauterine route. Inserted 02/08/16, Disp: , Rfl:  .  cetirizine (ZYRTEC) 10 MG tablet, Take 10 mg by mouth daily., Disp: , Rfl:  .  cyclobenzaprine (FLEXERIL) 10 MG tablet, Take 1 tablet (10 mg total) by mouth 3 (three) times daily as needed for muscle spasms. (Patient not taking: Reported on 09/29/2016), Disp: 30 tablet, Rfl: 0 .  ibuprofen (ADVIL,MOTRIN) 800 MG tablet, Take 800 mg by mouth every 6 (six) hours as needed. Pain., Disp: , Rfl:  .  metroNIDAZOLE (FLAGYL) 500 MG tablet, Take 1 tablet (500 mg total) by mouth 2 (two) times daily. (Patient not taking: Reported on 08/31/2018), Disp: 14 tablet, Rfl: 2 .  metroNIDAZOLE (NUVESSA) 1.3 % GEL, Place 1 Applicatorful vaginally at bedtime., Disp: 1 Tube,  Rfl: 0 .  rizatriptan (MAXALT-MLT) 5 MG disintegrating tablet, Take 1 tablet (5 mg total) by mouth as needed. May repeat in 2 hours if needed (Patient not taking: Reported on 02/19/2017), Disp: 15 tablet, Rfl: 6 .  topiramate (TOPAMAX) 50 MG tablet, One po bid xone week, then 2 tabs po bid (Patient not taking: Reported on 09/29/2016), Disp: 120 tablet, Rfl: 11 No Known Allergies  Social History   Tobacco Use  . Smoking status: Never Smoker  . Smokeless tobacco: Never Used  Substance Use Topics  . Alcohol use: No    Alcohol/week: 0.0 standard drinks    Family History  Problem Relation Age of Onset  . Diabetes Mother   . Hypertension Mother   . Mental illness Mother        Review of Systems Constitutional: negative for weight loss Genitourinary:negative for abnormal menstrual periods and vaginal discharge   Objective:   BP 136/84   Pulse 90   Ht 5\' 3"  (1.6 m)   Wt 266 lb 9.6 oz (120.9 kg)   BMI 47.23 kg/m    General:   alert  Skin:   no rash or abnormalities  Lungs:   clear to auscultation bilaterally  Heart:   regular rate and rhythm, S1, S2 normal, no murmur, click, rub or gallop  Breasts:   normal without suspicious masses, skin or nipple changes or axillary nodes  Abdomen:  normal findings: no organomegaly, soft, non-tender and no hernia  Pelvis:  External genitalia: normal general appearance Urinary system: urethral meatus normal and bladder without fullness, nontender Vaginal: normal without tenderness, induration or masses Cervix: normal appearance.  IUD string not visible Adnexa: normal bimanual exam Uterus: anteverted and non-tender, normal size   Lab Review Urine pregnancy test Labs reviewed yes Radiologic studies reviewed yes  50% of 15 min visit spent on counseling and coordination of care.    Assessment:    22 y.o., continuing Kyleena IUD, no contraindications.  1. IUD check up - can't feel IUD strings - pelvic pain  2. Intrauterine  contraceptive device threads lost, subsequent encounter Rx: - US PELVIC COMPLETE WITH TRANSVAGINAL; Future  3. BV (bacterial vaginosis) Rx: - metroNIDAZOLE (NUVESSA) 1.3 % GEL; Place 1 Applicatorful vaginally at bedtime.  Dispense: 1 Tube; Refill: 0 (sample dispensed)   Plan:    All questions answered. Diagnosis explained in detail, including differential. Follow up in 2 weeks.    Meds ordered this encounter  Medications  . metroNIDAZOLE (NUVESSA) 1.3 % GEL    Sig: Place 1 Applicatorful vaginally at bedtime.    Dispense:  1 Tube    Refill:  0   Orders Placed This Encounter  Procedures  . US PELVIC COMPLETE WITH TRANSVAGINAL    Standing Status:   Future    Standing Expiration Date:   11/01/2019    Order Specific Question:   Reason for Exam (SYMPTOM  OR DIAGNOSIS REQUIRED)    Answer:   IUD strings lost.  Pelvic pain.    Order Specific Question:   Preferred imaging location?    Answer:   [facility] Julio Alm MD 08-31-2018

## 2018-09-08 ENCOUNTER — Ambulatory Visit (HOSPITAL_COMMUNITY)
Admission: RE | Admit: 2018-09-08 | Discharge: 2018-09-08 | Disposition: A | Discharge status: Home / Self Care | Payer: Medicaid Other | Source: Ambulatory Visit | Attending: Obstetrics | Admitting: Obstetrics

## 2018-09-08 DIAGNOSIS — T8332XD Displacement of intrauterine contraceptive device, subsequent encounter: Secondary | ICD-10-CM

## 2018-09-08 DIAGNOSIS — Z30431 Encounter for routine checking of intrauterine contraceptive device: Secondary | ICD-10-CM | POA: Insufficient documentation

## 2018-09-15 ENCOUNTER — Ambulatory Visit (INDEPENDENT_AMBULATORY_CARE_PROVIDER_SITE_OTHER): Payer: Medicaid Other | Admitting: Obstetrics

## 2018-09-15 ENCOUNTER — Encounter: Payer: Self-pay | Admitting: Obstetrics

## 2018-09-15 DIAGNOSIS — Z6841 Body Mass Index (BMI) 40.0 and over, adult: Secondary | ICD-10-CM

## 2018-09-15 DIAGNOSIS — Z309 Encounter for contraceptive management, unspecified: Secondary | ICD-10-CM | POA: Diagnosis not present

## 2018-09-15 DIAGNOSIS — T8332XD Displacement of intrauterine contraceptive device, subsequent encounter: Secondary | ICD-10-CM

## 2018-09-15 NOTE — Progress Notes (Signed)
Subjective:    Lindsey Moon is a 22 y.o. female who presents for contraception counseling. The patient has no complaints today. The patient is sexually active. Pertinent past medical history: none.  The information documented in the HPI was reviewed and verified.  Menstrual History: OB History    Gravida  0   Para  0   Term  0   Preterm  0   AB  0   Living  0     SAB  0   TAB  0   Ectopic  0   Multiple  0   Live Births  0            No LMP recorded. (Menstrual status: IUD).   Patient Active Problem List   Diagnosis Date Noted  . Morning headache 09/11/2016  . Somnolence, daytime 09/11/2016  . Encounter for IUD insertion 02/08/2016  . Chronic migraine 12/19/2015  . Back pain 12/19/2015  . 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 Allergic rhinitis   . Diabetes mellitus without complication (HCC)   . GERD (gastroesophageal reflux disease)   . Headache   . Obesity   . Scoliosis   . TMJ disease     Past Surgical History:  Procedure Laterality Date  . arm surgery Left      Current Outpatient Medications:  .  cetirizine (ZYRTEC) 10 MG tablet, Take 10 mg by mouth daily., Disp: , Rfl:  .  cyclobenzaprine (FLEXERIL) 10 MG tablet, Take 1 tablet (10 mg total) by mouth 3 (three) times daily as needed for muscle spasms. (Patient not taking: Reported on 09/29/2016), Disp: 30 tablet, Rfl: 0 .  ibuprofen (ADVIL,MOTRIN) 800 MG tablet, Take 800 mg by mouth every 6 (six) hours as needed. Pain., Disp: , Rfl:  .  Levonorgestrel (KYLEENA) 19.5 MG IUD, by Intrauterine route. Inserted 02/08/16, Disp: , Rfl:  .  metroNIDAZOLE (FLAGYL) 500 MG tablet, Take 1 tablet (500 mg total) by mouth 2 (two) times daily. (Patient not taking: Reported on 08/31/2018), Disp: 14 tablet, Rfl: 2 .  metroNIDAZOLE (NUVESSA) 1.3 % GEL, Place 1 Applicatorful vaginally at bedtime., Disp: 1 Tube, Rfl: 0 .  rizatriptan (MAXALT-MLT) 5 MG disintegrating tablet, Take 1  tablet (5 mg total) by mouth as needed. May repeat in 2 hours if needed (Patient not taking: Reported on 02/19/2017), Disp: 15 tablet, Rfl: 6 .  topiramate (TOPAMAX) 50 MG tablet, One po bid xone week, then 2 tabs po bid (Patient not taking: Reported on 09/29/2016), Disp: 120 tablet, Rfl: 11 No Known Allergies  Social History   Tobacco Use  . Smoking status: Never Smoker  . Smokeless tobacco: Never Used  Substance Use Topics  . Alcohol use: No    Alcohol/week: 0.0 standard drinks    Family History  Problem Relation Age of Onset  . Diabetes Mother   . Hypertension Mother   . Mental illness Mother        Review of Systems Constitutional: negative for weight loss Genitourinary:negative for abnormal menstrual periods and vaginal discharge   Objective:   There were no vitals taken for this visit.    PE:  Deferred  Lab Review Urine pregnancy test Labs reviewed yes Radiologic studies reviewed yes ULTRASOUND:        US PELVIC COMPLETE WITH TRANSVAGINAL (Accession [id number]) (Order [id number])  Imaging  Date: 09/08/2018 Department: THE [facility] [place] ULTRASOUND Released By: Johnny Bridge, NT Authorizing: Brock Bad, MD  Exam Information   Status Exam Begun  Exam Ended   Final [99] 09/08/2018 1:00 PM 09/08/2018 1:48 PM  PACS Images   Show images for US PELVIC COMPLETE WITH TRANSVAGINAL  Study Result   CLINICAL DATA:  IUD string lost  EXAM: TRANSABDOMINAL AND TRANSVAGINAL ULTRASOUND OF PELVIS  TECHNIQUE: Both transabdominal and transvaginal ultrasound examinations of the pelvis were performed. Transabdominal technique was performed for global imaging of the pelvis including uterus, ovaries, adnexal regions, and pelvic cul-de-sac. It was necessary to proceed with endovaginal exam following the transabdominal exam to visualize the uterus, endometrium, ovaries and adnexa.  COMPARISON:  None  FINDINGS: Uterus  Measurements: 9.6 x 4.3  x 5.0 cm. No fibroids or other mass visualized. Uterus is retroverted.  Endometrium  Thickness: 9 mm in thickness. IUD in expected position within the endometrial canal  Right ovary  Measurements: 4.6 x 2.6 x 2.7 cm. Normal appearance/no adnexal mass.  Left ovary  Measurements: 3.1 x 1.9 x 1.8 cm. Normal appearance/no adnexal mass.  Other findings  Trace free fluid in the pelvis.  IMPRESSION: IUD in expected position within the endometrial canal.   Electronically Signed   By: Charlett Nose M.D.   On: 09/08/2018 15:13     >50% of 10 min visit spent on counseling and coordination of care.    Assessment:    22 y.o., continuing IUD, no contraindications.   Plan:   1. Intrauterine contraceptive device threads lost, subsequent encounter - ultrasound reveals IUD in normal intrauterine position  2. Class 3 severe obesity due to excess calories without serious comorbidity with body mass index (BMI) of 45.0 to 49.9 in adult [facility]) - program of caloric reduction, exercise and behavioral modification recommended   All questions answered. Contraception: IUD. Follow up as needed.   No orders of the defined types were placed in this encounter.  No orders of the defined types were placed in this encounter.   Brock Bad MD 09-15-2018

## 2018-09-15 NOTE — Progress Notes (Signed)
Presents for US results. 

## 2019-12-07 ENCOUNTER — Ambulatory Visit: Payer: Medicaid Other | Admitting: Obstetrics

## 2019-12-13 ENCOUNTER — Ambulatory Visit: Payer: Medicaid Other | Admitting: Obstetrics

## 2019-12-14 ENCOUNTER — Ambulatory Visit (INDEPENDENT_AMBULATORY_CARE_PROVIDER_SITE_OTHER): Payer: Medicaid Other | Admitting: Obstetrics

## 2019-12-14 ENCOUNTER — Ambulatory Visit: Payer: Medicaid Other | Admitting: Obstetrics

## 2019-12-14 ENCOUNTER — Other Ambulatory Visit: Payer: Self-pay

## 2019-12-14 ENCOUNTER — Other Ambulatory Visit (HOSPITAL_COMMUNITY)
Admission: RE | Admit: 2019-12-14 | Discharge: 2019-12-14 | Disposition: A | Discharge status: Home / Self Care | Payer: Medicaid Other | Source: Ambulatory Visit | Attending: Obstetrics | Admitting: Obstetrics

## 2019-12-14 ENCOUNTER — Encounter: Payer: Self-pay | Admitting: Obstetrics

## 2019-12-14 VITALS — BP 128/84 | HR 93 | Wt 280.2 lb

## 2019-12-14 DIAGNOSIS — Z01419 Encounter for gynecological examination (general) (routine) without abnormal findings: Secondary | ICD-10-CM | POA: Insufficient documentation

## 2019-12-14 DIAGNOSIS — N898 Other specified noninflammatory disorders of vagina: Secondary | ICD-10-CM | POA: Insufficient documentation

## 2019-12-14 DIAGNOSIS — Z6841 Body Mass Index (BMI) 40.0 and over, adult: Secondary | ICD-10-CM

## 2019-12-14 DIAGNOSIS — T8332XA Displacement of intrauterine contraceptive device, initial encounter: Secondary | ICD-10-CM

## 2019-12-14 DIAGNOSIS — Z0000 Encounter for general adult medical examination without abnormal findings: Secondary | ICD-10-CM | POA: Diagnosis not present

## 2019-12-14 DIAGNOSIS — Z30431 Encounter for routine checking of intrauterine contraceptive device: Secondary | ICD-10-CM

## 2019-12-14 NOTE — Progress Notes (Signed)
Pt is here for IUD string check. Pt reports she is not having any issues with the IUD. IUD Kyleena inserted 02/08/16.

## 2019-12-14 NOTE — Progress Notes (Signed)
Subjective:        Lindsey Moon is a 24 y.o. female here for a routine exam.  Current complaints: Can't feel IUD strings.    Personal health questionnaire:  Is patient Ashkenazi Jewish, have a family history of breast and/or ovarian cancer: no Is there a family history of uterine cancer diagnosed at age < 26, gastrointestinal cancer, urinary tract cancer, family member who is a Personnel officer syndrome-associated carrier: yes Is the patient overweight and hypertensive, family history of diabetes, personal history of gestational diabetes, preeclampsia or PCOS: yes Is patient over 58, have PCOS,  family history of premature CHD under age 99, diabetes, smoke, have hypertension or peripheral artery disease:  no At any time, has a partner hit, kicked or otherwise hurt or frightened you?: no Over the past 2 weeks, have you felt down, depressed or hopeless?: no Over the past 2 weeks, have you felt little interest or pleasure in doing things?:no   Gynecologic History Patient's last menstrual period was 11/09/2019 (approximate). Contraception: IUD Last Pap: 03-08-2018. Results were: normal Last mammogram: n/a. Results were: n/a  Obstetric History OB History  Gravida Para Term Preterm AB Living  0 0 0 0 0 0  SAB TAB Ectopic Multiple Live Births  0 0 0 0 0    Past Medical History:  Diagnosis Date  . Allergic rhinitis   . Diabetes mellitus without complication (HCC)   . GERD (gastroesophageal reflux disease)   . Headache   . Obesity   . Scoliosis   . TMJ disease     Past Surgical History:  Procedure Laterality Date  . arm surgery Left      Current Outpatient Medications:  .  Levonorgestrel (KYLEENA) 19.5 MG IUD, by Intrauterine route. Inserted 02/08/16, Disp: , Rfl:  .  cetirizine (ZYRTEC) 10 MG tablet, Take 10 mg by mouth daily., Disp: , Rfl:  .  cyclobenzaprine (FLEXERIL) 10 MG tablet, Take 1 tablet (10 mg total) by mouth 3 (three) times daily as needed for muscle spasms.  (Patient not taking: Reported on 09/29/2016), Disp: 30 tablet, Rfl: 0 .  ibuprofen (ADVIL,MOTRIN) 800 MG tablet, Take 800 mg by mouth every 6 (six) hours as needed. Pain., Disp: , Rfl:  .  metroNIDAZOLE (FLAGYL) 500 MG tablet, Take 1 tablet (500 mg total) by mouth 2 (two) times daily. (Patient not taking: Reported on 08/31/2018), Disp: 14 tablet, Rfl: 2 .  metroNIDAZOLE (NUVESSA) 1.3 % GEL, Place 1 Applicatorful vaginally at bedtime., Disp: 1 Tube, Rfl: 0 .  rizatriptan (MAXALT-MLT) 5 MG disintegrating tablet, Take 1 tablet (5 mg total) by mouth as needed. May repeat in 2 hours if needed (Patient not taking: Reported on 02/19/2017), Disp: 15 tablet, Rfl: 6 .  topiramate (TOPAMAX) 50 MG tablet, One po bid xone week, then 2 tabs po bid (Patient not taking: Reported on 09/29/2016), Disp: 120 tablet, Rfl: 11 No Known Allergies  Social History   Tobacco Use  . Smoking status: Never Smoker  . Smokeless tobacco: Never Used  Substance Use Topics  . Alcohol use: No    Alcohol/week: 0.0 standard drinks    Family History  Problem Relation Age of Onset  . Diabetes Mother   . Hypertension Mother   . Mental illness Mother       Review of Systems  Constitutional: negative for fatigue and weight loss Respiratory: negative for cough and wheezing Cardiovascular: negative for chest pain, fatigue and palpitations Gastrointestinal: negative for abdominal pain and change in bowel habits  Musculoskeletal:negative for myalgias Neurological: negative for gait problems and tremors Behavioral/Psych: negative for abusive relationship, depression Endocrine: negative for temperature intolerance    Genitourinary:negative for abnormal menstrual periods, genital lesions, hot flashes, sexual problems and vaginal discharge Integument/breast: negative for breast lump, breast tenderness, nipple discharge and skin lesion(s)    Objective:       BP 128/84   Pulse 93   Wt 280 lb 3.2 oz (127.1 kg)   LMP 11/09/2019  (Approximate)   BMI 49.64 kg/m  General:   alert  Skin:   no rash or abnormalities  Lungs:   clear to auscultation bilaterally  Heart:   regular rate and rhythm, S1, S2 normal, no murmur, click, rub or gallop  Breasts:   normal without suspicious masses, skin or nipple changes or axillary nodes  Abdomen:  normal findings: no organomegaly, soft, non-tender and no hernia  Pelvis:  External genitalia: normal general appearance Urinary system: urethral meatus normal and bladder without fullness, nontender Vaginal: normal without tenderness, induration or masses Cervix: normal appearance.  IUD string not visible. Adnexa: normal bimanual exam Uterus: anteverted and non-tender, normal size   Lab Review Urine pregnancy test Labs reviewed yes Radiologic studies reviewed yes  50% of 25 min visit spent on counseling and coordination of care.   Assessment:     1. Encounter for routine gynecological examination with Papanicolaou smear of cervix Rx: - Cytology - PAP( [place])  2. Vaginal discharge Rx: - Cervicovaginal ancillary only( [place])  3. IUD check up - strings not visible  4. Intrauterine contraceptive device threads lost, initial encounter Rx: - US PELVIC COMPLETE WITH TRANSVAGINAL; Future  5. Class 3 severe obesity due to excess calories without serious comorbidity with body mass index (BMI) of 45.0 to 49.9 in adult [facility]) - program of caloric restriction, exercise and behavioral modification recommended    Plan:    Education reviewed: calcium supplements, depression evaluation, low fat, low cholesterol diet, safe sex/STD prevention, self breast exams and weight bearing exercise. Contraception: IUD. Follow up in: 2 weeks.   Shelly Bombard, MD 12/14/2019 3:02 PM   No orders of the defined types were placed in this encounter.  Orders Placed This Encounter  Procedures  . US PELVIC COMPLETE WITH TRANSVAGINAL    Standing Status:   Future    Standing  Expiration Date:   02/10/2021    Order Specific Question:   Reason for Exam (SYMPTOM  OR DIAGNOSIS REQUIRED)    Answer:   IUD strings lost    Order Specific Question:   Preferred imaging location?    Answer:   [place]    Shelly Bombard, MD 12/14/2019 2:54 PM

## 2019-12-15 LAB — CERVICOVAGINAL ANCILLARY ONLY
Chlamydia: NEGATIVE
Comment: NEGATIVE
Comment: NORMAL
Neisseria Gonorrhea: NEGATIVE

## 2019-12-21 ENCOUNTER — Ambulatory Visit (HOSPITAL_COMMUNITY)
Admission: RE | Admit: 2019-12-21 | Discharge: 2019-12-21 | Disposition: A | Discharge status: Home / Self Care | Payer: Self-pay | Source: Ambulatory Visit | Attending: Obstetrics | Admitting: Obstetrics

## 2019-12-21 ENCOUNTER — Other Ambulatory Visit: Payer: Self-pay

## 2019-12-21 DIAGNOSIS — T8332XA Displacement of intrauterine contraceptive device, initial encounter: Secondary | ICD-10-CM | POA: Insufficient documentation

## 2019-12-26 ENCOUNTER — Other Ambulatory Visit: Payer: Self-pay

## 2019-12-26 ENCOUNTER — Encounter: Payer: Self-pay | Admitting: Obstetrics

## 2019-12-26 ENCOUNTER — Ambulatory Visit (INDEPENDENT_AMBULATORY_CARE_PROVIDER_SITE_OTHER): Payer: Self-pay | Admitting: Obstetrics

## 2019-12-26 VITALS — BP 123/80 | HR 74 | Wt 281.9 lb

## 2019-12-26 DIAGNOSIS — T8332XD Displacement of intrauterine contraceptive device, subsequent encounter: Secondary | ICD-10-CM

## 2019-12-26 DIAGNOSIS — R350 Frequency of micturition: Secondary | ICD-10-CM

## 2019-12-26 LAB — POCT URINALYSIS DIPSTICK
Bilirubin, UA: NEGATIVE
Blood, UA: NEGATIVE
Glucose, UA: NEGATIVE
Ketones, UA: NEGATIVE
Leukocytes, UA: NEGATIVE
Nitrite, UA: NEGATIVE
Protein, UA: POSITIVE — AB
Spec Grav, UA: 1.015 (ref 1.010–1.025)
Urobilinogen, UA: 0.2 E.U./dL
pH, UA: 6.5 (ref 5.0–8.0)

## 2019-12-26 NOTE — Progress Notes (Signed)
Subjective:    Lindsey Moon is a 24 y.o. female who presents for contraception counseling. The patient has continued lower abdominal pain.  She says that she was at [facility] and her work-up was negative, including negative blood-work, urine and wet prep.  She says that she also had a negative MRI of abdomen / pelvis. The patient is sexually active. Pertinent past medical history: migraines and sexually transmitted diseases.  The information documented in the HPI was reviewed and verified.  Menstrual History: OB History    Gravida  0   Para  0   Term  0   Preterm  0   AB  0   Living  0     SAB  0   TAB  0   Ectopic  0   Multiple  0   Live Births  0            Patient's last menstrual period was 11/13/2019.   Patient Active Problem List   Diagnosis Date Noted  . Morning headache 09/11/2016  . Somnolence, daytime 09/11/2016  . Encounter for IUD insertion 02/08/2016  . Chronic migraine 12/19/2015  . Back pain 12/19/2015  . 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 Allergic rhinitis   . Diabetes mellitus without complication ([place])   . GERD (gastroesophageal reflux disease)   . Headache   . Obesity   . Scoliosis   . TMJ disease     Past Surgical History:  Procedure Laterality Date  . arm surgery Left      Current Outpatient Medications:  .  cetirizine (ZYRTEC) 10 MG tablet, Take 10 mg by mouth daily., Disp: , Rfl:  .  cyclobenzaprine (FLEXERIL) 10 MG tablet, Take 1 tablet (10 mg total) by mouth 3 (three) times daily as needed for muscle spasms. (Patient not taking: Reported on 09/29/2016), Disp: 30 tablet, Rfl: 0 .  ibuprofen (ADVIL,MOTRIN) 800 MG tablet, Take 800 mg by mouth every 6 (six) hours as needed. Pain., Disp: , Rfl:  .  Levonorgestrel (KYLEENA) 19.5 MG IUD, by Intrauterine route. Inserted 02/08/16, Disp: , Rfl:  .  metroNIDAZOLE (FLAGYL) 500 MG tablet, Take 1 tablet (500 mg total) by mouth 2 (two) times  daily. (Patient not taking: Reported on 08/31/2018), Disp: 14 tablet, Rfl: 2 .  metroNIDAZOLE (NUVESSA) 1.3 % GEL, Place 1 Applicatorful vaginally at bedtime., Disp: 1 Tube, Rfl: 0 .  rizatriptan (MAXALT-MLT) 5 MG disintegrating tablet, Take 1 tablet (5 mg total) by mouth as needed. May repeat in 2 hours if needed (Patient not taking: Reported on 02/19/2017), Disp: 15 tablet, Rfl: 6 .  topiramate (TOPAMAX) 50 MG tablet, One po bid xone week, then 2 tabs po bid (Patient not taking: Reported on 09/29/2016), Disp: 120 tablet, Rfl: 11 No Known Allergies  Social History   Tobacco Use  . Smoking status: Never Smoker  . Smokeless tobacco: Never Used  Substance Use Topics  . Alcohol use: No    Alcohol/week: 0.0 standard drinks    Family History  Problem Relation Age of Onset  . Diabetes Mother   . Hypertension Mother   . Mental illness Mother        Review of Systems Constitutional: negative for weight loss Genitourinary:negative for abnormal menstrual periods and vaginal discharge GI: positive for lower abdominal pain  Objective:   BP 123/80   Pulse 74   Wt 281 lb 14.4 oz (127.9 kg)   LMP 11/13/2019   BMI  49.94 kg/m     PE:  Deferred  Lab Review Urine pregnancy test Labs reviewed yes Radiologic studies reviewed yes US PELVIC COMPLETE WITH TRANSVAGINAL (Accession [id number]) (Order [id number]) Imaging Date: 12/21/2019 Department: [facility] Released By: Elesa Hacker Authorizing: Brock Bad, MD  Exam Status  Status  Final [99]  PACS Intelerad Image Link  Show images for US PELVIC COMPLETE WITH TRANSVAGINAL  Study Result  CLINICAL DATA:  IUD, lost threads/strings  EXAM: TRANSABDOMINAL AND TRANSVAGINAL ULTRASOUND OF PELVIS  TECHNIQUE: Both transabdominal and transvaginal ultrasound examinations of the pelvis were performed. Transabdominal technique was performed for global imaging of the pelvis including uterus,  ovaries, adnexal regions, and pelvic cul-de-sac. It was necessary to proceed with endovaginal exam following the transabdominal exam to visualize the endometrium and ovaries.  COMPARISON:  09/08/2018  FINDINGS: Uterus  Measurements: 8.5 x 3.9 x 5.0 cm = volume: 86 mL. Retroverted. Normal morphology without mass  Endometrium  Thickness: 5 mm. IUD within endometrial canal at the upper uterine segment in expected position. No endometrial fluid or other focal abnormality  Right ovary  Measurements: 4.7 x 2.4 x 2.2 cm = volume: 11.8 mL. Normal morphology without mass  Left ovary  Measurements: 3.1 x 2.4 x 1.6 cm = volume: 6.4 mL. Normal morphology without mass  Other findings  Trace free pelvic fluid question physiologic.  No adnexal masses.  IMPRESSION: IUD within expected position at the upper uterine segment.  Otherwise negative exam.   Electronically Signed   By: Ulyses Southward M.D.   On: 12/21/2019 16:12   Scans on Order [id number]  Scan on 12/21/2019 3:50 PM by Magnus Ivan, RT   Result History  US PELVIC COMPLETE WITH TRANSVAGINAL (Order #[id number]) on 12/21/2019 - Order Result History Report  MyChart Results Release  MyChart Status: Code Exp Results Release  Encounter-Level Documents - 12/21/2019:  Electronic signature on 12/21/2019 2:59 PM - E-signed Scan on 12/21/2019 3:50 PM by Default, Provider, MD     Order-Level Documents - 12/21/2019:  Scan on 12/21/2019 3:50 PM by Magnus Ivan, RT     Hospital account-Level Documents:  There are no hospital account-level documents. Vitals  Height Weight BMI (Calculated)   281 lb 14.4 oz (127.9 kg)   Imaging  Imaging Information  Resulted by:  Signed Date/Time  Phone Pager  [place], MARK 12/21/2019 4:12 PM [phone]  Original Order  Ordered On Ordered By   12/14/2019 2:49 PM Brock Bad, MD       External Result Report  External Result Report    50%  of 15 min visit spent on counseling and coordination of care.    Assessment:    24 y.o., continuing IUD, no contraindications.   Plan:   1. Intrauterine contraceptive device threads lost, subsequent encounter - IUD in place  2. Urinary frequency Rx: - Urine Culture - POCT Urinalysis Dipstick:  Negative   All questions answered. Contraception: IUD. Diagnosis explained in detail, including differential. Discussed healthy lifestyle modifications. Follow up as needed. Urinalysis.   No orders of the defined types were placed in this encounter.  Orders Placed This Encounter  Procedures  . Urine Culture  . POCT Urinalysis Dipstick    Brock Bad, MD 12/26/2019 3:57 PM

## 2019-12-26 NOTE — Progress Notes (Signed)
Pt is in the office for follow up U/S results. Pt reports urinary frequency, pelvic/back pain, and nausea. Pt was seen at [facility] on 12-25-19 and reports that results were negative.

## 2019-12-28 LAB — URINE CULTURE

## 2020-01-04 ENCOUNTER — Telehealth: Payer: Medicaid Other | Admitting: Obstetrics

## 2020-11-09 IMAGING — US US PELVIS COMPLETE WITH TRANSVAGINAL
1 series · 15 of 25 positions shown · non-contrast
Comparison: 09/08/2018

CLINICAL DATA: IUD, lost threads/strings

EXAM:
TRANSABDOMINAL AND TRANSVAGINAL ULTRASOUND OF PELVIS
TECHNIQUE: Both transabdominal and transvaginal ultrasound examinations of the
pelvis were performed. Transabdominal technique was performed for
global imaging of the pelvis including uterus, ovaries, adnexal
regions, and pelvic cul-de-sac. It was necessary to proceed with
endovaginal exam following the transabdominal exam to visualize the
endometrium and ovaries.

[Series 1: us pelvis complete with transvaginal · 74 acquisitions, 15 frames shown]
[im 1/74]
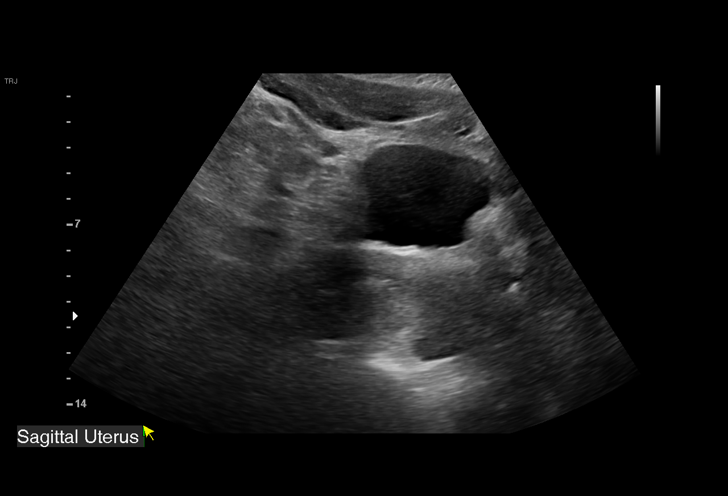
[im 7/74]
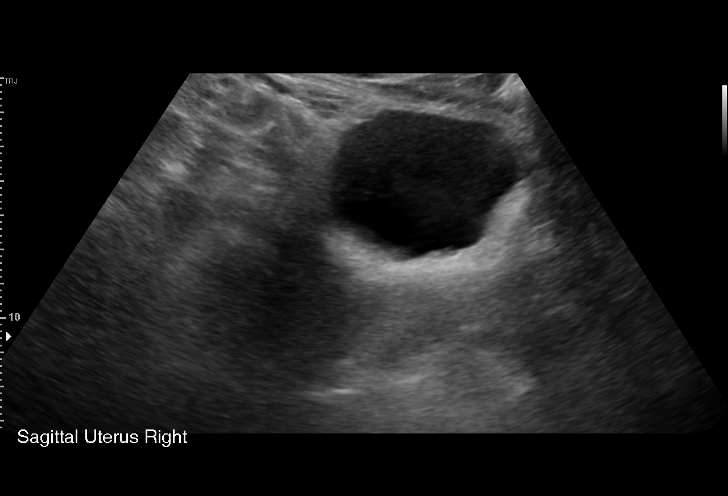
[im 13/74]
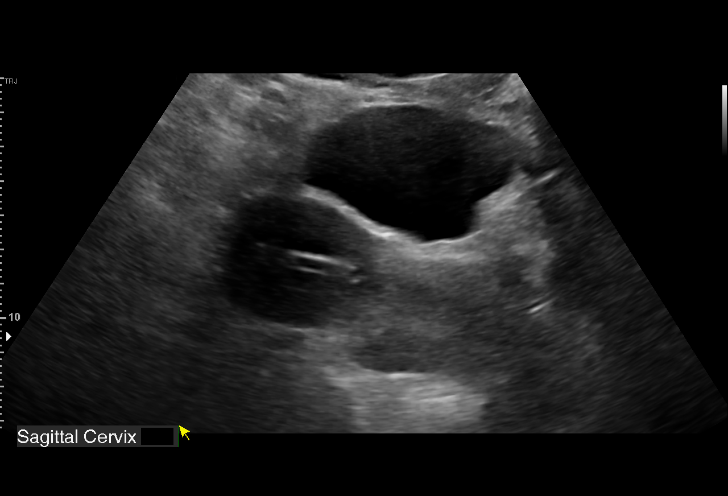
[im 16/74]
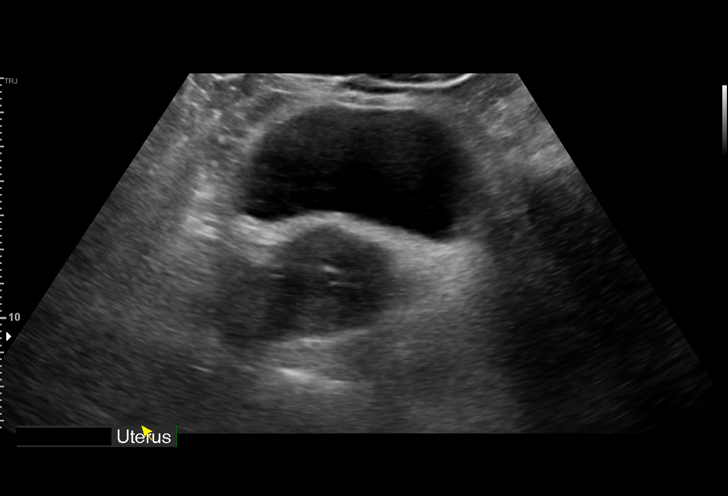
[im 22/74]
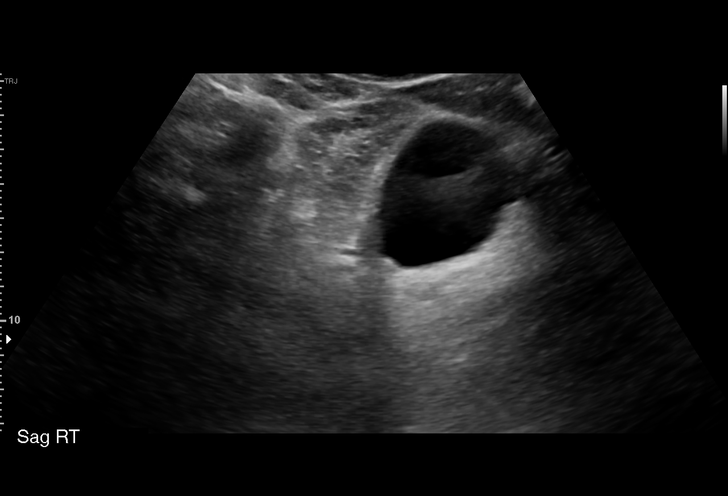
[im 28/74]
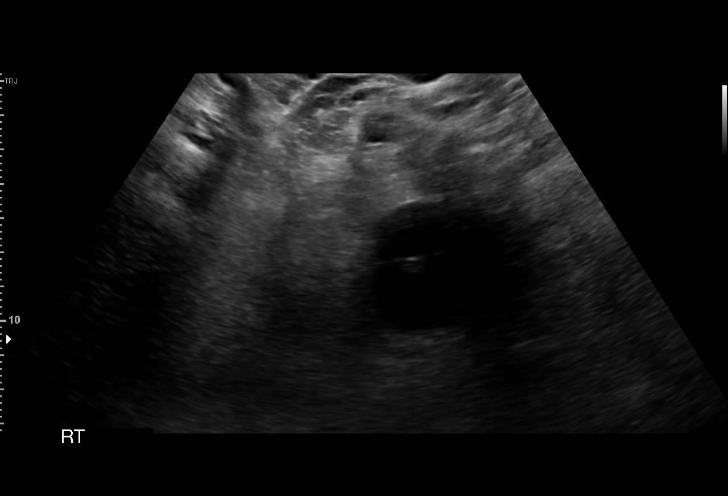
[im 31/74]
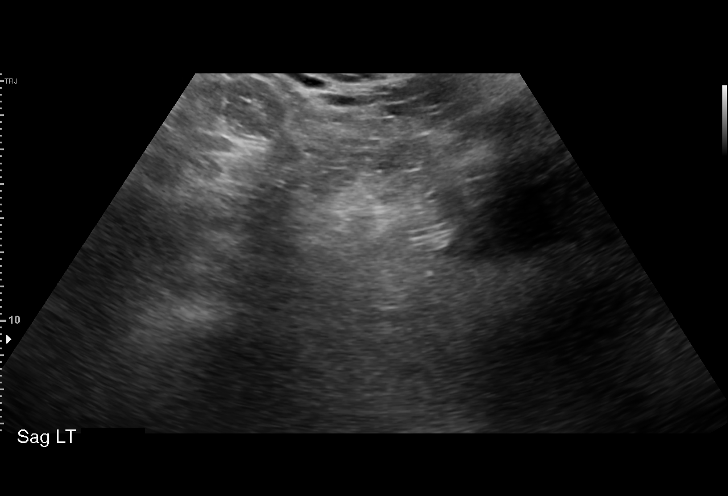
[im 37/74]
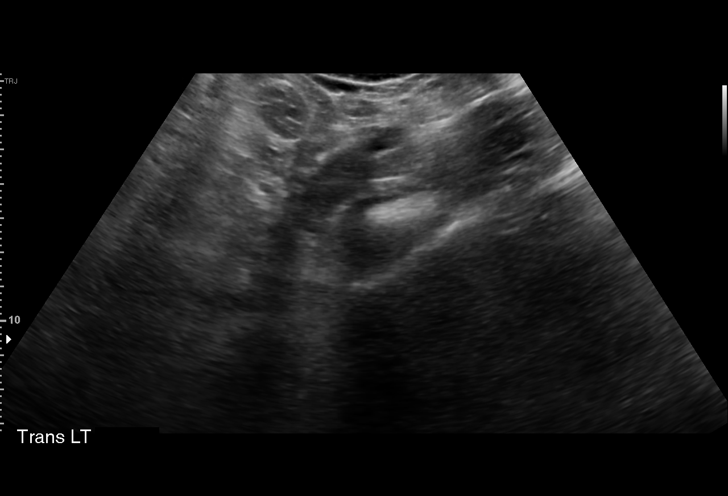
[im 43/74]
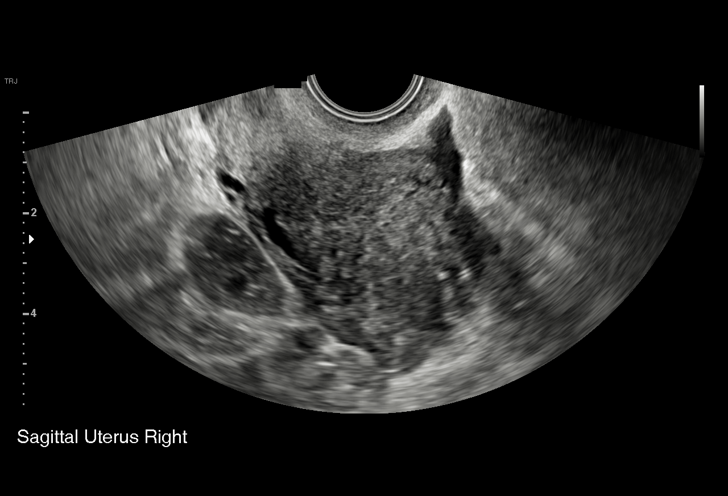
[im 46/74]
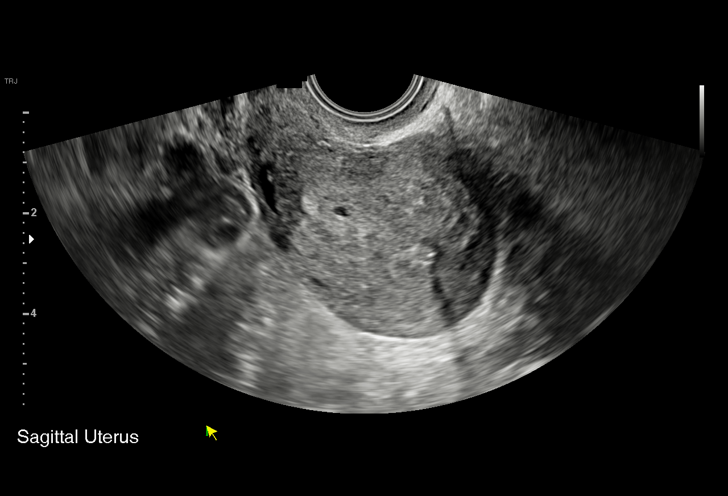
[im 52/74]
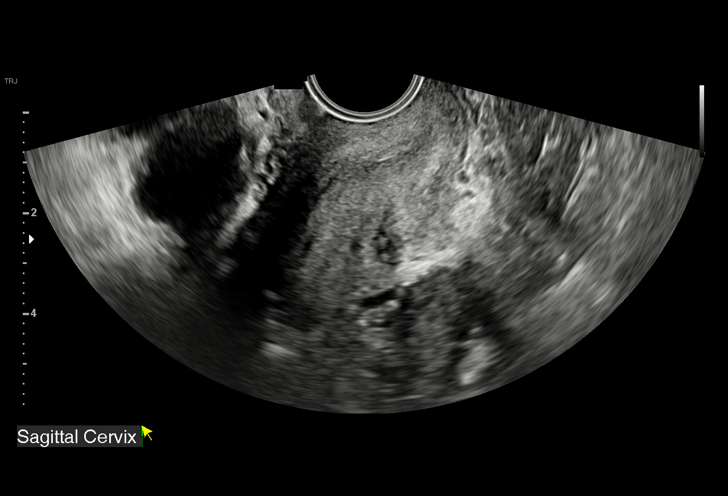
[im 58/74]
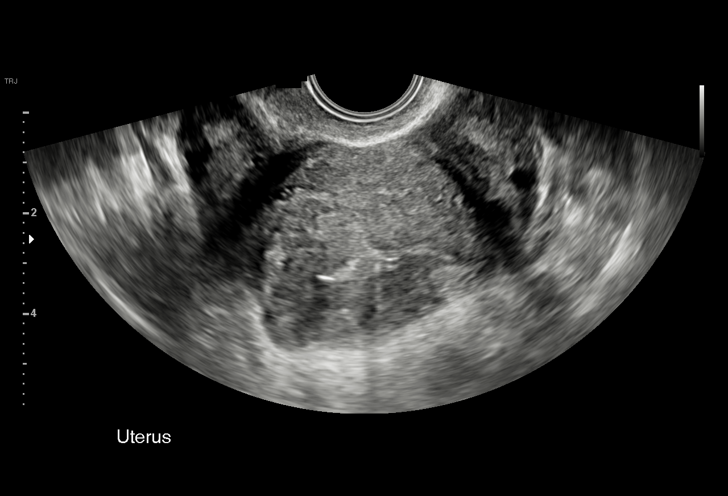
[im 61/74]
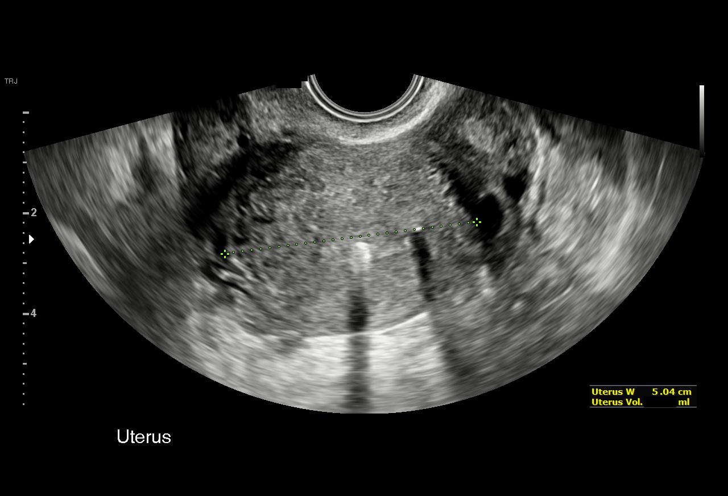
[im 67/74]
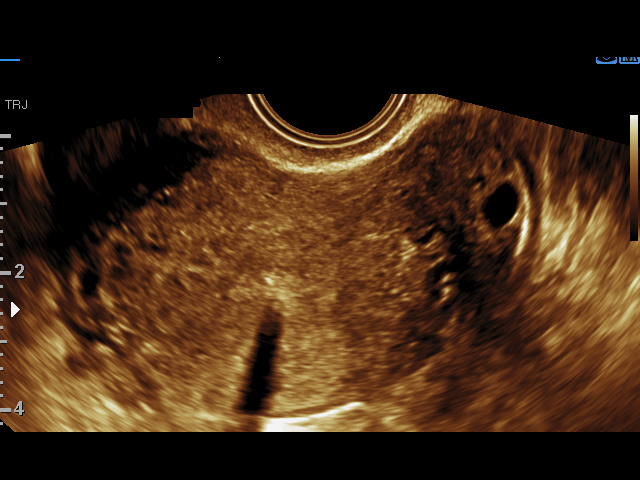
[im 74/74]
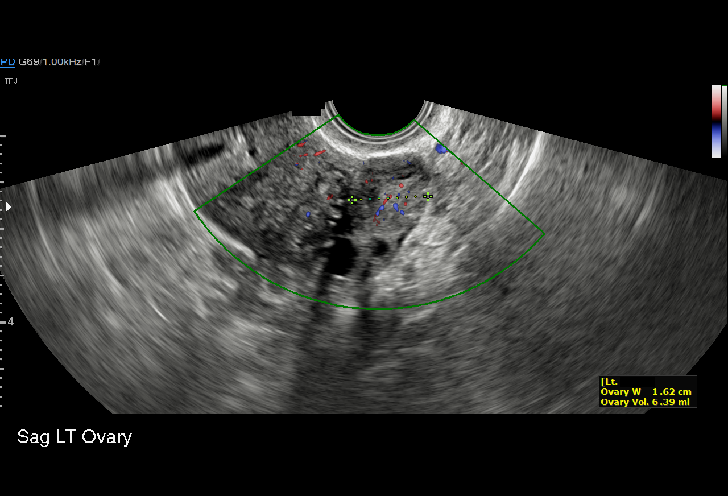

[15 of 25 positions shown; findings below may reference images not displayed]

FINDINGS: Uterus

Measurements: 8.5 x 3.9 x 5.0 cm = volume: 86 mL. Retroverted.
Normal morphology without mass

Endometrium

Thickness: 5 mm. IUD within endometrial canal at the upper uterine
segment in expected position. No endometrial fluid or other focal
abnormality

Right ovary

Measurements: 4.7 x 2.4 x 2.2 cm = volume: 11.8 mL. Normal
morphology without mass

Left ovary

Measurements: 3.1 x 2.4 x 1.6 cm = volume: 6.4 mL. Normal morphology
without mass

Other findings

Trace free pelvic fluid question physiologic.  No adnexal masses.
IMPRESSION: IUD within expected position at the upper uterine segment.

Otherwise negative exam.

## 2020-12-04 ENCOUNTER — Other Ambulatory Visit: Payer: Self-pay

## 2020-12-04 ENCOUNTER — Other Ambulatory Visit (HOSPITAL_COMMUNITY)
Admission: RE | Admit: 2020-12-04 | Discharge: 2020-12-04 | Disposition: A | Discharge status: Home / Self Care | Payer: Medicaid Other | Source: Ambulatory Visit | Attending: Obstetrics | Admitting: Obstetrics

## 2020-12-04 ENCOUNTER — Ambulatory Visit (INDEPENDENT_AMBULATORY_CARE_PROVIDER_SITE_OTHER): Payer: Medicaid Other | Admitting: Obstetrics

## 2020-12-04 ENCOUNTER — Encounter: Payer: Self-pay | Admitting: Obstetrics

## 2020-12-04 VITALS — BP 120/81 | HR 76 | Ht 63.0 in | Wt 283.3 lb

## 2020-12-04 DIAGNOSIS — N898 Other specified noninflammatory disorders of vagina: Secondary | ICD-10-CM | POA: Insufficient documentation

## 2020-12-04 DIAGNOSIS — R350 Frequency of micturition: Secondary | ICD-10-CM

## 2020-12-04 DIAGNOSIS — Z30431 Encounter for routine checking of intrauterine contraceptive device: Secondary | ICD-10-CM

## 2020-12-04 DIAGNOSIS — Z01419 Encounter for gynecological examination (general) (routine) without abnormal findings: Secondary | ICD-10-CM | POA: Diagnosis not present

## 2020-12-04 DIAGNOSIS — R3915 Urgency of urination: Secondary | ICD-10-CM

## 2020-12-04 LAB — POCT URINALYSIS DIPSTICK
Bilirubin, UA: NEGATIVE
Glucose, UA: NEGATIVE
Ketones, UA: NEGATIVE
Protein, UA: POSITIVE — AB
Spec Grav, UA: 1.025 (ref 1.010–1.025)
Urobilinogen, UA: 0.2 E.U./dL
pH, UA: 6.5 (ref 5.0–8.0)

## 2020-12-04 MED ORDER — CEFUROXIME AXETIL 500 MG PO TABS: 500.0000 mg | ORAL_TABLET | Freq: Two times a day (BID) | ORAL | 0 refills | Status: DC

## 2020-12-04 NOTE — Progress Notes (Signed)
Patient presents for IUD check. Kyleena IUD placed 02/08/2016. Patient states that she is having some abdominal pain around umbilicus and breakthrough bleeding. Patient denies having any vaginal discharge, odor, or irritation  Last pap: 03/08/2018 Normal

## 2020-12-04 NOTE — Progress Notes (Addendum)
Subjective:        Lindsey Moon is a 25 y.o. female here for a routine exam.  Current complaints: Urinary frequency and urgency, and suprapubic pain that radiates to her back..  Also has vaginal discharge.  Personal health questionnaire:  Is patient Ashkenazi Jewish, have a family history of breast and/or ovarian cancer: no Is there a family history of uterine cancer diagnosed at age < 44, gastrointestinal cancer, urinary tract cancer, family member who is a Personnel officer syndrome-associated carrier: no Is the patient overweight and hypertensive, family history of diabetes, personal history of gestational diabetes, preeclampsia or PCOS: yes Is patient over 26, have PCOS,  family history of premature CHD under age 28, diabetes, smoke, have hypertension or peripheral artery disease:  no At any time, has a partner hit, kicked or otherwise hurt or frightened you?: no Over the past 2 weeks, have you felt down, depressed or hopeless?: no Over the past 2 weeks, have you felt little interest or pleasure in doing things?:no   Gynecologic History No LMP recorded. (Menstrual status: IUD). Contraception: IUD Last Pap: 03-08-2018. Results were: normal Last mammogram: n/a. Results were: n/a  Obstetric History OB History  Gravida Para Term Preterm AB Living  0 0 0 0 0 0  SAB IAB Ectopic Multiple Live Births  0 0 0 0 0    Past Medical History:  Diagnosis Date  . Allergic rhinitis   . Diabetes mellitus without complication (HCC)   . GERD (gastroesophageal reflux disease)   . Headache   . Obesity   . Scoliosis   . TMJ disease     Past Surgical History:  Procedure Laterality Date  . arm surgery Left      Current Outpatient Medications:  .  cefUROXime (CEFTIN) 500 MG tablet, Take 1 tablet (500 mg total) by mouth 2 (two) times daily with a meal., Disp: 14 tablet, Rfl: 0 .  cetirizine (ZYRTEC) 10 MG tablet, Take 10 mg by mouth daily., Disp: , Rfl:  .  cyclobenzaprine (FLEXERIL) 10 MG  tablet, Take 1 tablet (10 mg total) by mouth 3 (three) times daily as needed for muscle spasms. (Patient not taking: Reported on 09/29/2016), Disp: 30 tablet, Rfl: 0 .  ibuprofen (ADVIL,MOTRIN) 800 MG tablet, Take 800 mg by mouth every 6 (six) hours as needed. Pain., Disp: , Rfl:  .  Levonorgestrel (KYLEENA) 19.5 MG IUD, by Intrauterine route. Inserted 02/08/16, Disp: , Rfl:  .  metroNIDAZOLE (FLAGYL) 500 MG tablet, Take 1 tablet (500 mg total) by mouth 2 (two) times daily. (Patient not taking: Reported on 08/31/2018), Disp: 14 tablet, Rfl: 2 .  metroNIDAZOLE (NUVESSA) 1.3 % GEL, Place 1 Applicatorful vaginally at bedtime., Disp: 1 Tube, Rfl: 0 .  rizatriptan (MAXALT-MLT) 5 MG disintegrating tablet, Take 1 tablet (5 mg total) by mouth as needed. May repeat in 2 hours if needed (Patient not taking: Reported on 02/19/2017), Disp: 15 tablet, Rfl: 6 .  topiramate (TOPAMAX) 50 MG tablet, One po bid xone week, then 2 tabs po bid (Patient not taking: Reported on 09/29/2016), Disp: 120 tablet, Rfl: 11 No Known Allergies  Social History   Tobacco Use  . Smoking status: Never Smoker  . Smokeless tobacco: Never Used  Substance Use Topics  . Alcohol use: No    Alcohol/week: 0.0 standard drinks    Family History  Problem Relation Age of Onset  . Diabetes Mother   . Hypertension Mother   . Mental illness Mother  Review of Systems  Constitutional: negative for fatigue and weight loss Respiratory: negative for cough and wheezing Cardiovascular: negative for chest pain, fatigue and palpitations Gastrointestinal: negative for abdominal pain and change in bowel habits Musculoskeletal:negative for myalgias Neurological: negative for gait problems and tremors Behavioral/Psych: negative for abusive relationship, depression Endocrine: negative for temperature intolerance    Genitourinary:negative for abnormal menstrual periods, genital lesions, hot flashes, sexual problems.  Positive for vaginal  discharge and urinary frequency and urgency Integument/breast: negative for breast lump, breast tenderness, nipple discharge and skin lesion(s)    Objective:       BP 120/81   Pulse 76   Ht 5\' 3"  (1.6 m)   Wt 283 lb 4.8 oz (128.5 kg)   BMI 50.18 kg/m  General:   alert and no distress  Skin:   no rash or abnormalities  Lungs:   clear to auscultation bilaterally  Heart:   regular rate and rhythm, S1, S2 normal, no murmur, click, rub or gallop  Breasts:   normal without suspicious masses, skin or nipple changes or axillary nodes  Abdomen:  normal findings: no organomegaly, soft, non-tender and no hernia  Pelvis:  External genitalia: normal general appearance Urinary system: urethral meatus normal and bladder without fullness, nontender Vaginal: normal without tenderness, induration or masses Cervix: normal appearance.  Small os, IUD strings not visible Adnexa: normal bimanual exam Uterus: retroverted and non-tender, normal size   Lab Review Urine pregnancy test Labs reviewed yes Radiologic studies reviewed yes  50% of 20 min visit spent on counseling and coordination of care.   Assessment:     1. Encounter for gynecological examination with Papanicolaou smear of cervix Rx: - Cytology - PAP( [place])  2. Vaginal discharge Rx: - Cervicovaginal ancillary only  3. IUD check up - strings not visible, but ultrasound done to show IUD in normal position - IUD due for removal in a few months and she wants to switch to a different contraceptive.  Educational material dispensed on contraceptive choices  4. Urinary frequency and urgency with suprapubic pain Rx: - POCT Urinalysis Dipstick - cefUROXime (CEFTIN) 500 MG tablet; Take 1 tablet (500 mg total) by mouth 2 (two) times daily with a meal.  Dispense: 14 tablet; Refill: 0 - urine culture   Plan:    Education reviewed: calcium supplements, depression evaluation, low fat, low cholesterol diet, safe sex/STD prevention,  self breast exams and weight bearing exercise. Contraception: IUD. Follow up in: 2 months.  IUD removal.  Meds ordered this encounter  Medications  . cefUROXime (CEFTIN) 500 MG tablet    Sig: Take 1 tablet (500 mg total) by mouth 2 (two) times daily with a meal.    Dispense:  14 tablet    Refill:  0   Orders Placed This Encounter  Procedures  . Urine Culture  . POCT Urinalysis Dipstick    , MD 12/04/2020 11:07 AM

## 2020-12-05 LAB — CERVICOVAGINAL ANCILLARY ONLY
Bacterial Vaginitis (gardnerella): POSITIVE — AB
Candida Glabrata: NEGATIVE
Candida Vaginitis: NEGATIVE
Chlamydia: NEGATIVE
Comment: NEGATIVE
Comment: NEGATIVE
Comment: NEGATIVE
Comment: NEGATIVE
Comment: NEGATIVE
Comment: NORMAL
Neisseria Gonorrhea: NEGATIVE
Trichomonas: POSITIVE — AB

## 2020-12-06 ENCOUNTER — Other Ambulatory Visit: Payer: Self-pay | Admitting: Obstetrics

## 2020-12-06 DIAGNOSIS — B9689 Other specified bacterial agents as the cause of diseases classified elsewhere: Secondary | ICD-10-CM

## 2020-12-06 DIAGNOSIS — N760 Acute vaginitis: Secondary | ICD-10-CM

## 2020-12-06 LAB — CYTOLOGY - PAP: Diagnosis: NEGATIVE

## 2020-12-06 LAB — URINE CULTURE

## 2020-12-06 MED ORDER — METRONIDAZOLE 500 MG PO TABS: 500.0000 mg | ORAL_TABLET | Freq: Two times a day (BID) | ORAL | 2 refills | Status: DC

## 2021-01-28 ENCOUNTER — Other Ambulatory Visit: Payer: Self-pay

## 2021-01-28 ENCOUNTER — Encounter: Payer: Self-pay | Admitting: Obstetrics & Gynecology

## 2021-01-28 ENCOUNTER — Ambulatory Visit (INDEPENDENT_AMBULATORY_CARE_PROVIDER_SITE_OTHER): Payer: Medicaid Other | Admitting: Obstetrics & Gynecology

## 2021-01-28 VITALS — BP 129/76 | HR 97 | Ht 63.0 in | Wt 277.0 lb

## 2021-01-28 DIAGNOSIS — Z30432 Encounter for removal of intrauterine contraceptive device: Secondary | ICD-10-CM

## 2021-01-28 NOTE — Progress Notes (Signed)
    GYNECOLOGY OFFICE PROCEDURE NOTE  Lindsey Moon is a 25 y.o. G0P0000 here for [facility] IUD removal. No GYN concerns.  Last pap smear was on 12/04/20 and was normal.  IUD Removal  Patient identified, informed consent performed, consent signed.  Patient was in the dorsal lithotomy position, normal external genitalia was noted.  A speculum was placed in the patient's vagina, normal discharge was noted, no lesions. The cervix was visualized, no lesions, no abnormal discharge. The strings of the IUD were not visualized, so Kelly forceps were introduced into the endometrial cavity and the IUD was grasped and removed in its entirety.  Patient tolerated the procedure well.    Patient will use barrier method for contraception. Routine preventative health maintenance measures emphasized.  Adam Phenix, MD Obstetrician & Gynecologist, [facility] for [facility], [facility] Patient ID: Lindsey Moon, female   DOB: Mar 29, 1996, 25 y.o.   MRN: [id number]

## 2021-01-28 NOTE — Patient Instructions (Signed)

## 2021-01-28 NOTE — Progress Notes (Signed)
Pt presents today for removal of IUD. Last gyn exam was on 12/04/20. Pt states she does not want any other BC at this time.

## 2023-01-29 ENCOUNTER — Telehealth: Payer: Self-pay

## 2023-01-29 NOTE — Telephone Encounter (Signed)
Patient called stating that she has been on her pill for over a year. She states that she had a cycle in January, but did not have one in February. Patient states that she takes her pill everyday, but not at the same time everyday. Patient states that she has taken a HPT with a negative results. She states that she has been sexually active. Patient advised to retake a HPT in a week or wait to see if her cycle comes on for March. Patient verbalized understanding.

## 2024-08-30 NOTE — Progress Notes (Signed)
 I have reviewed thyroid ultrasound done at [facility] on August 22, 2024.  Patient has 1.5 x 1.2 cm right sided thyroid nodule which appears to be stable.  This nodule has been biopsied in the past.  No interventions are necessary at this time.  Continue to monitor.

## 2024-10-04 ENCOUNTER — Encounter: Payer: Self-pay | Admitting: Allergy

## 2024-10-04 ENCOUNTER — Ambulatory Visit: Payer: Self-pay | Admitting: Allergy

## 2024-10-04 VITALS — BP 112/82 | HR 86 | Resp 16 | Ht 63.0 in | Wt 266.0 lb

## 2024-10-04 DIAGNOSIS — L700 Acne vulgaris: Secondary | ICD-10-CM | POA: Diagnosis not present

## 2024-10-04 DIAGNOSIS — L508 Other urticaria: Secondary | ICD-10-CM | POA: Diagnosis not present

## 2024-10-04 DIAGNOSIS — L301 Dyshidrosis [pompholyx]: Secondary | ICD-10-CM | POA: Diagnosis not present

## 2024-10-04 MED ORDER — FAMOTIDINE 20 MG PO TABS: 20.0000 mg | ORAL_TABLET | Freq: Two times a day (BID) | ORAL | 5 refills | Status: AC

## 2024-10-04 MED ORDER — CETIRIZINE HCL 10 MG PO TABS: 10.0000 mg | ORAL_TABLET | Freq: Two times a day (BID) | ORAL | 5 refills | Status: AC

## 2024-10-04 MED ORDER — TRIAMCINOLONE ACETONIDE 0.1 % EX OINT: TOPICAL_OINTMENT | CUTANEOUS | 3 refills | Status: DC

## 2024-10-04 MED ORDER — CLINDAMYCIN PHOS-BENZOYL PEROX 1-5 % EX GEL: Freq: Every day | CUTANEOUS | 3 refills | Status: AC | PRN

## 2024-10-04 NOTE — Patient Instructions (Addendum)
 Chronic spontaneous urticaria (hives) Chronic urticaria with daily hives, itching, and occasional periorbital swelling.   - hives and swelling is unknown.  Hives can be caused by a variety of different triggers including illness/infection, foods, medications, stings, exercise, pressure, vibrations, extremes of temperature to name a few however majority of the time there is no identifiable trigger.  Your symptoms have been ongoing for >6 weeks making this chronic thus will obtain labwork to evaluate: CBC w diff, CMP, tryptase, hive panel, environmental panel, alpha-gal panel, inflammatory markers - Prescribed Zyrtec  10mg  1 tab twice a day and Pepcid 20mg  1 tab twice a day - Discussed potential Singulair if needed. - Discussed biologic therapies if needed (Xolair monthly injections vs Rhapsido twice a day oral tabs)  Dyshidrotic eczema of the hands Rash on arms Dyshidrotic eczema with blisters and dry skin, worsened by irritants.  - Prescribed triamcinolone 0.5% ointment twice a day as needed for rash/blisters - Keep skin moisturized.  - Use Benzaclin apply pea-sized amount to affected area once daily when needed    - Schedule follow-up in two months.

## 2024-10-04 NOTE — Progress Notes (Signed)
 New Patient Note  RE: Lindsey Moon MRN: [id number] DOB: Mar 31, 1996 Date of Office Visit: 10/04/2024  Primary care provider: Sharyne Harlene LITTIE, NP  Chief Complaint: hives   History of present illness: Lindsey Moon is a 28 y.o. female presenting today for evaluation of hives.  Discussed the use of AI scribe software for clinical note transcription with the patient, who gave verbal consent to proceed.  She has been experiencing a rash for the past four months. The rash occurs daily, appearing as welts and bumps that can present anywhere on body.  She has pictures provided that are consistent with hives on the breasts, arms, legs, back.  The hives are itchy and sometimes accompanied by swelling of her lips and eyes. The rash can appear at night and be gone by morning.  Does not leave any bruising.  Denies any fevers or joint/ache pains.    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 She experiences hives at her sister's house and in her own bed, suggesting no specific environmental trigger. She has tried changing her seating position and location, such as moving from a recliner to a couch, but the hives persist regardless of location. She has taken Zyrtec  in the past for her allergy symptoms but was unable to refill the prescription. She has not been using any other antihistamines or medications for the hives recently. She recalls having some light symptoms of illness prior to the onset of the hives but does not report any specific illness.  She has had blisters and bumps on her hands, which she manages with hydrocortisone  cream. The eczema presents as blisters on her palms that do not disappear, and dry skin as the bumps fade.  This is a different type of rash than the hives she reports above. She has previously used Benzacline for acne-related bumps that occur on her upper arms, which she describes as looking like 'heat bumps' that appear  more in the summer.  Review of systems: 10pt ROS negative unless noted above in HPI  Past medical history: Past Medical History:  Diagnosis Date   Allergic rhinitis    Diabetes mellitus without complication (HCC)    GERD (gastroesophageal reflux disease)    Headache    Obesity    Scoliosis    TMJ disease     Past surgical history: Past Surgical History:  Procedure Laterality Date   arm surgery Left     Family history:  Family History  Problem Relation Age of Onset   Diabetes Mother    Hypertension Mother    Mental illness Mother     Social history: Lives in a apartment with carpeting with heat pump heating and central cooling.  Dog in the home. Cats outside the home.  No concern for water damage, mildew or roaches in the home. Works as  PCA in haematologist.  Vaping history from 10/24 or before.    Medication List: Current Outpatient Medications  Medication Sig Dispense Refill   clindamycin-benzoyl peroxide (BENZACLIN) gel Apply topically daily as needed. 25 g 3   famotidine (PEPCID) 20 MG tablet Take 1 tablet (20 mg total) by mouth 2 (two) times daily. 60 tablet 5   triamcinolone ointment (KENALOG) 0.1 % Apply to affected areas twice daily as needed below face and neck 30 g 3   valACYclovir (VALTREX) 500 MG tablet Take 500 mg by mouth daily.     cetirizine  (ZYRTEC ) 10 MG tablet  Take 1 tablet (10 mg total) by mouth 2 (two) times daily. 60 tablet 5   famotidine (PEPCID) 40 MG tablet Take 40 mg by mouth daily. (Patient not taking: Reported on 10/04/2024)     No current facility-administered medications for this visit.    Known medication allergies: No Known Allergies   Physical examination: Blood pressure 112/82, pulse 86, resp. rate 16, height 5' 3 (1.6 m), weight 266 lb (120.7 kg), SpO2 99%.  General: Alert, interactive, in no acute distress. HEENT: PERRLA, TMs pearly gray, turbinates non-edematous without discharge, post-pharynx non erythematous. Neck: Supple  without lymphadenopathy. Lungs: Clear to auscultation without wheezing, rhonchi or rales. {no increased work of breathing. CV: Normal S1, S2 without murmurs. Abdomen: Nondistended, nontender. Skin: Warm and dry, without lesions or rashes. Extremities:  No clubbing, cyanosis or edema. Neuro:   Grossly intact.  Diagnostics/Labs: Labs reviewed from 09/05/2024: Component Ref Range & Units 4 wk ago  WHITE BLOOD CELL(WBC) COUNT 3.4 - 10.8 x10E3/uL 5.2  RED BLOOD CELL (RBC) COUNT 3.77 - 5.28 x10E6/uL 4.75  HEMOGLOBIN 11.1 - 15.9 g/dL 87.1  HEMATOCRIT 65.9 - 46.6 % 39.6  MCV 79 - 97 fL 83  MCH 26.6 - 33.0 pg 26.9  MCHC 31.5 - 35.7 g/dL 67.6  RDW 88.2 - 84.5 % 12.7  PLATELETS 150 - 450 x10E3/uL 326  NEUTROPHILS Not Estab. % 48  LYMPHS Not Estab. % 45  MONOCYTES Not Estab. % 6  EOS Not Estab. % 1  BASOPHILS Not Estab. % 0  NEUTROPHILS (ABSOLUTE) 1.4 - 7.0 x10E3/uL 2.5  LYMPHS (ABSOLUTE) 0.7 - 3.1 x10E3/uL 2.4  MONOCYTES(ABSOLUTE) 0.1 - 0.9 x10E3/uL 0.3  EOS (ABSOLUTE) 0.0 - 0.4 x10E3/uL 0.1  BASO (ABSOLUTE) 0.0 - 0.2 x10E3/uL 0.0  IMMATURE GRANULOCYTES Not Estab. % 0  IMMATURE GRANS (ABS) 0.0 - 0.1 x10E3/uL 0.0   TSH 0.450 - 4.500 uIU/mL 0.615  T4, FREE(DIRECT) 0.82 - 1.77 ng/dL 8.86   HCV AB Non Reactive Non Reactive   GLUCOSE, SERUM 70 - 99 mg/dL 899 High   BUN 6 - 20 mg/dL 8  CREATININE, SERUM 9.42 - 1.00 mg/dL 9.16  eGFR >40 fO/fpw/8.26 98  BUN/CREATININE RATIO 9 - 23 10  SODIUM, SERUM 134 - 144 mmol/L 138  POTASSIUM, SERUM 3.5 - 5.2 mmol/L 4.2  CHLORIDE, SERUM 96 - 106 mmol/L 100  CARBON DIOXIDE, TOTAL 20 - 29 mmol/L 24  CALCIUM, SERUM 8.7 - 10.2 mg/dL 9.8  PROTEIN, TOTAL, SERUM 6.0 - 8.5 g/dL 7.0  ALBUMIN, SERUM 4.0 - 5.0 g/dL 4.3  GLOBULIN, TOTAL 1.5 - 4.5 g/dL 2.7  BILIRUBIN, TOTAL 0.0 - 1.2 mg/dL 0.7  ALKALINE PHOSPHATASE, SERUM 41 - 116 IU/L 100  AST (SGOT) 0 - 40 IU/L 8  ALT (SGPT) 0 - 32 IU/L 13     Assessment and  plan:   Chronic spontaneous urticaria (hives) Chronic urticaria with daily hives, itching, and occasional periorbital swelling.   - hives and swelling is unknown.  Hives can be caused by a variety of different triggers including illness/infection, foods, medications, stings, exercise, pressure, vibrations, extremes of temperature to name a few however majority of the time there is no identifiable trigger.  Your symptoms have been ongoing for >6 weeks making this chronic thus will obtain labwork to evaluate: CBC w diff, CMP, tryptase, hive panel, environmental panel, alpha-gal panel, inflammatory markers - Prescribed Zyrtec  10mg  1 tab twice a day and Pepcid 20mg  1 tab twice a day - Discussed potential Singulair if needed. - Discussed  biologic therapies if needed (Xolair monthly injections vs Rhapsido twice a day oral tabs)  Dyshidrotic eczema of the hands Rash on arms Dyshidrotic eczema with blisters and dry skin, worsened by irritants.  - Prescribed triamcinolone 0.5% ointment twice a day as needed for rash/blisters - Keep skin moisturized.  - Use Benzaclin apply pea-sized amount to affected area once daily when needed  - Schedule follow-up in two months.  I appreciate the opportunity to take part in Donte's care. Please do not hesitate to contact me with questions.  Sincerely,   Danita Brain, MD Allergy/Immunology [facility]

## 2024-12-10 LAB — CHRONIC URTICARIA (CU) EVAL
ALT: 9 IU/L (ref 0–32)
Basophils Absolute: 0 x10E3/uL (ref 0.0–0.2)
Basos: 0 %
CRP: 2 mg/L (ref 0–10)
EOS (ABSOLUTE): 0.1 x10E3/uL (ref 0.0–0.4)
Eos: 1 %
Hematocrit: 40.2 % (ref 34.0–46.6)
Hemoglobin: 12.9 g/dL (ref 11.1–15.9)
Immature Grans (Abs): 0 x10E3/uL (ref 0.0–0.1)
Immature Granulocytes: 0 %
Lymphocytes Absolute: 2.3 x10E3/uL (ref 0.7–3.1)
Lymphs: 42 %
MCH: 27.3 pg (ref 26.6–33.0)
MCHC: 32.1 g/dL (ref 31.5–35.7)
MCV: 85 fL (ref 79–97)
Monocytes Absolute: 0.3 x10E3/uL (ref 0.1–0.9)
Monocytes: 6 %
Neutrophils Absolute: 2.8 x10E3/uL (ref 1.4–7.0)
Neutrophils: 51 %
Platelets: 306 x10E3/uL (ref 150–450)
Pooled Donor- BAT CU: 9.7 % (ref 0.00–10.60)
RBC: 4.72 x10E6/uL (ref 3.77–5.28)
RDW: 13.1 % (ref 11.7–15.4)
Sed Rate: 24 mm/h (ref 0–32)
TSH: 0.654 u[IU]/mL (ref 0.450–4.500)
Thyroperoxidase Ab SerPl-aCnc: 11 [IU]/mL (ref 0–34)
WBC: 5.5 x10E3/uL (ref 3.4–10.8)

## 2024-12-10 LAB — ALLERGEN PROFILE WITH TOTAL IGE, RESPIRATORY-AREA 2
Alternaria Alternata IgE: <0.1 kU/L
Aspergillus Fumigatus IgE: <0.1 kU/L
Bermuda Grass IgE: 0.11 kU/L — AB
Cat Dander IgE: <0.1 kU/L
Cedar, Mountain IgE: <0.1 kU/L
Cladosporium Herbarum IgE: <0.1 kU/L
Cockroach, German IgE: <0.1 kU/L
Common Silver Birch IgE: <0.1 kU/L
Cottonwood IgE: <0.1 kU/L
D Farinae IgE: <0.1 kU/L
D Pteronyssinus IgE: <0.1 kU/L
Dog Dander IgE: 0.48 kU/L — AB
Elm, American IgE: <0.1 kU/L
Johnson Grass IgE: <0.1 kU/L
Maple/Box Elder IgE: <0.1 kU/L
Mouse Urine IgE: <0.1 kU/L
Oak, White IgE: <0.1 kU/L
Pecan, Hickory IgE: <0.1 kU/L
Penicillium Chrysogen IgE: <0.1 kU/L
Pigweed, Rough IgE: <0.1 kU/L
Ragweed, Short IgE: <0.1 kU/L
Sheep Sorrel IgE Qn: <0.1 kU/L
Timothy Grass IgE: <0.1 kU/L
White Mulberry IgE: <0.1 kU/L

## 2024-12-10 LAB — ALPHA-GAL PANEL
Allergen Lamb IgE: <0.1 kU/L
Beef IgE: <0.1 kU/L
IgE (Immunoglobulin E), Serum: 543 [IU]/mL — ABNORMAL HIGH (ref 6–495)
O215-IgE Alpha-Gal: <0.1 kU/L
Pork IgE: <0.1 kU/L

## 2024-12-10 LAB — TRYPTASE: Tryptase: 2.6 ug/L (ref 2.2–13.2)

## 2024-12-13 ENCOUNTER — Ambulatory Visit: Admitting: Allergy

## 2024-12-13 ENCOUNTER — Ambulatory Visit: Payer: Self-pay | Admitting: Allergy

## 2024-12-13 ENCOUNTER — Encounter: Payer: Self-pay | Admitting: Allergy

## 2024-12-13 VITALS — BP 112/80 | HR 82 | Resp 18

## 2024-12-13 DIAGNOSIS — L508 Other urticaria: Secondary | ICD-10-CM | POA: Diagnosis not present

## 2024-12-13 DIAGNOSIS — L301 Dyshidrosis [pompholyx]: Secondary | ICD-10-CM | POA: Diagnosis not present

## 2024-12-13 MED ORDER — TRIAMCINOLONE ACETONIDE 0.1 % EX OINT: TOPICAL_OINTMENT | CUTANEOUS | 1 refills | Status: AC

## 2024-12-13 NOTE — Progress Notes (Signed)
 "   Follow-up Note  RE: Lindsey Moon MRN: [id number] DOB: 12/12/1995 Date of Office Visit: 12/13/2024   History of present illness: Lindsey Moon is a 29 y.o. female presenting today for follow-up of chronic urticaria, dyshidrotic eczema.  She was last seen in the office on 10/04/2024 by myself. Discussed the use of AI scribe software for clinical note transcription with the patient, who gave verbal consent to proceed.  Hives have improved and are more manageable, with occasional breakthrough symptoms. She has not been taking Zyrtec  and Pepcid  regularly but keeps them available for use if needed. The hives tend to appear while she is at work and resolve on their own.  She has a dog that sleeps in her room and notices increased allergy symptoms at home, particularly when the dog sheds. She has had her dog since it was 33 old, and it is now three years old.  Previously experienced a rash on her hands and feet, which resolved with the use of triamcinolone  cream. She applied the cream to her hands and feet, which were peeling, and reports that the rash is now gone. She stopped using the cream once the rash cleared but is running low on her supply.  Review of systems: 10pt ROS negative unless noted in HPI  Past medical/social/surgical/family history have been reviewed and are unchanged unless specifically indicated below.  No changes  Medication List: Current Outpatient Medications  Medication Sig Dispense Refill   cetirizine  (ZYRTEC ) 10 MG tablet Take 1 tablet (10 mg total) by mouth 2 (two) times daily. 60 tablet 5   clindamycin -benzoyl peroxide (BENZACLIN) gel Apply topically daily as needed. 25 g 3   famotidine  (PEPCID ) 20 MG tablet Take 1 tablet (20 mg total) by mouth 2 (two) times daily. 60 tablet 5   famotidine  (PEPCID ) 40 MG tablet Take 40 mg by mouth daily.     triamcinolone  ointment (KENALOG ) 0.1 % Apply to affected areas twice daily as needed below face and neck  30 g 3   valACYclovir (VALTREX) 500 MG tablet Take 500 mg by mouth daily.     No current facility-administered medications for this visit.     Known medication allergies: Allergies[1]   Physical examination: Blood pressure 112/80, pulse 82, resp. rate 18, SpO2 96%.  General: Alert, interactive, in no acute distress. HEENT: PERRLA, TMs pearly gray, turbinates minimally edematous without discharge, post-pharynx non erythematous. Neck: Supple without lymphadenopathy. Lungs: Clear to auscultation without wheezing, rhonchi or rales. {no increased work of breathing. CV: Normal S1, S2 without murmurs. Abdomen: Nondistended, nontender. Skin: Warm and dry, without lesions or rashes. Extremities:  No clubbing, cyanosis or edema. Neuro:   Grossly intact.  Diagnostics/Labs: Labs:  Component     Latest Ref Rng 12/07/2024  ALT     0 - 32 IU/L 9   TSH     0.450 - 4.500 uIU/mL 0.654   Thyroperoxidase Ab SerPl-aCnc     0 - 34 IU/mL 11   CRP     0 - 10 mg/L 2   Pooled Donor- BAT CU     0.00 - 10.60 % 9.70   WBC     3.4 - 10.8 x10E3/uL 5.5   RBC     3.77 - 5.28 x10E6/uL 4.72   Hemoglobin     11.1 - 15.9 g/dL 87.0   HCT     65.9 - 53.3 % 40.2   MCV     79 - 97 fL 85  MCH     26.6 - 33.0 pg 27.3   MCHC     31.5 - 35.7 g/dL 67.8   RDW     88.2 - 84.5 % 13.1   Platelets     150 - 450 x10E3/uL 306   Neutrophils     Not Estab. % 51   Lymphs     Not Estab. % 42   Monocytes     Not Estab. % 6   Eos     Not Estab. % 1   Basos     Not Estab. % 0   NEUT#     1.4 - 7.0 x10E3/uL 2.8   Lymphs Abs     0.7 - 3.1 x10E3/uL 2.3   Monocytes Absolute     0.1 - 0.9 x10E3/uL 0.3   EOS (ABSOLUTE)     0.0 - 0.4 x10E3/uL 0.1   Basophils Absolute     0.0 - 0.2 x10E3/uL 0.0   Immature Granulocytes     Not Estab. % 0   Immature Grans (Abs)     0.0 - 0.1 x10E3/uL 0.0   Sed Rate     0 - 32 mm/hr 24   D Pteronyssinus IgE     Class 0 kU/L <0.10   D Farinae IgE     Class 0 kU/L <0.10    Cat Dander IgE     Class 0 kU/L <0.10   Dog Dander IgE     Class I kU/L 0.48 !   Mouse Urine IgE     Class 0 kU/L <0.10   Bermuda Grass IgE     Class 0/I kU/L 0.11 !   Timothy Grass IgE     Class 0 kU/L <0.10   Johnson Grass IgE     Class 0 kU/L <0.10   Cockroach, German IgE     Class 0 kU/L <0.10   Penicillium Chrysogen IgE     Class 0 kU/L <0.10   Cladosporium Herbarum IgE     Class 0 kU/L <0.10   Aspergillus Fumigatus IgE     Class 0 kU/L <0.10   Alternaria Alternata IgE     Class 0 kU/L <0.10   Maple/Box Elder IgE     Class 0 kU/L <0.10   Common Silver Valrie IgE     Class 0 kU/L <0.10   Cedar, [place] IgE     Class 0 kU/L <0.10   Oak, White IgE     Class 0 kU/L <0.10   Elm, American IgE     Class 0 kU/L <0.10   Cottonwood IgE     Class 0 kU/L <0.10   Pecan, Hickory IgE     Class 0 kU/L <0.10   White Mulberry IgE     Class 0 kU/L <0.10   Ragweed, Short IgE     Class 0 kU/L <0.10   Pigweed, Rough IgE     Class 0 kU/L <0.10   Sheep Sorrel IgE Qn     Class 0 kU/L <0.10   Class Description Allergens Comment   IgE (Immunoglobulin E), Serum     6 - 495 IU/mL 543 (H)   Pork IgE     Class 0 kU/L <0.10   Beef IgE     Class 0 kU/L <0.10   Allergen Lamb IgE     Class 0 kU/L <0.10   O215-IgE Alpha-Gal     Class 0 kU/L <0.10   Tryptase     2.2 -  13.2 ug/L 2.6     Assessment and plan:   Chronic spontaneous urticaria (hives) Chronic urticaria with daily hives, itching, and occasional periorbital swelling.  IMPROVED!  - hives and swelling is spontaneous in nature.  Hives can be caused by a variety of different triggers including illness/infection, foods, medications, stings, exercise, pressure, vibrations, extremes of temperature to name a few however majority of the time there is no identifiable trigger.   - labs reviewed with patient and showed detectable IgE (allergy signal) for dog dander and grass pollen otherwise labs were normal and reassuring.    Avoidance measures provided.  - Use Zyrtec  10mg  1 tab twice a day and Pepcid  20mg  1 tab twice a day - Discussed potential Singulair if needed. - Discussed biologic therapies if needed (Xolair monthly injections vs Rhapsido twice a day oral tabs)  Dyshidrotic eczema of the hands Rash on arms Dyshidrotic eczema with blisters and dry skin, worsened by irritants.  IMPROVED! - Use triamcinolone  0.5% ointment twice a day as needed for rash/blisters - Keep skin moisturized.  - Use Benzaclin apply pea-sized amount to affected area once daily when needed   - Schedule follow-up in 6 months.  I appreciate the opportunity to take part in Ellis's care. Please do not hesitate to contact me with questions.  Sincerely,   Danita Brain, MD Allergy/Immunology [facility]      [1] No Known Allergies  "

## 2024-12-13 NOTE — Patient Instructions (Addendum)
 Chronic spontaneous urticaria (hives) Chronic urticaria with daily hives, itching, and occasional periorbital swelling.   - hives and swelling is spontaneous in nature.  Hives can be caused by a variety of different triggers including illness/infection, foods, medications, stings, exercise, pressure, vibrations, extremes of temperature to name a few however majority of the time there is no identifiable trigger.   - labs showed detectable IgE (allergy signal) for dog dander and grass pollen otherwise labs were normal and reassuring.  - Use Zyrtec  10mg  1 tab twice a day and Pepcid  20mg  1 tab twice a day - Discussed potential Singulair if needed. - Discussed biologic therapies if needed (Xolair monthly injections vs Rhapsido twice a day oral tabs)  Dyshidrotic eczema of the hands Rash on arms Dyshidrotic eczema with blisters and dry skin, worsened by irritants.  - Use triamcinolone  0.5% ointment twice a day as needed for rash/blisters - Keep skin moisturized.  - Use Benzaclin apply pea-sized amount to affected area once daily when needed   - Schedule follow-up in 6 months.

## 2025-06-20 ENCOUNTER — Ambulatory Visit: Admitting: Allergy
